# Patient Record
Sex: Female | Born: 1980 | Race: White | Hispanic: No | Smoking: Never smoker
Health system: Southern US, Community
[De-identification: ages and names within clinical notes are randomized; demographics above are authoritative.]

## PROBLEM LIST (undated history)

## (undated) DIAGNOSIS — Z302 Encounter for sterilization: Secondary | ICD-10-CM

## (undated) DIAGNOSIS — I10 Essential (primary) hypertension: Secondary | ICD-10-CM

## (undated) HISTORY — DX: Encounter for sterilization: Z30.2

---

## 2000-01-22 ENCOUNTER — Other Ambulatory Visit: Admission: RE | Admit: 2000-01-22 | Discharge: 2000-01-22 | Payer: Self-pay | Admitting: Obstetrics and Gynecology

## 2000-06-02 ENCOUNTER — Inpatient Hospital Stay (HOSPITAL_COMMUNITY): Admission: AD | Admit: 2000-06-02 | Discharge: 2000-06-02 | Payer: Self-pay | Admitting: Obstetrics

## 2000-06-06 ENCOUNTER — Encounter: Payer: Self-pay | Admitting: *Deleted

## 2000-06-06 ENCOUNTER — Ambulatory Visit (HOSPITAL_COMMUNITY): Admission: RE | Admit: 2000-06-06 | Discharge: 2000-06-06 | Payer: Self-pay | Admitting: *Deleted

## 2000-06-27 ENCOUNTER — Ambulatory Visit (HOSPITAL_COMMUNITY): Admission: RE | Admit: 2000-06-27 | Discharge: 2000-06-27 | Payer: Self-pay | Admitting: *Deleted

## 2000-06-27 ENCOUNTER — Encounter: Payer: Self-pay | Admitting: *Deleted

## 2000-07-18 ENCOUNTER — Ambulatory Visit (HOSPITAL_COMMUNITY): Admission: RE | Admit: 2000-07-18 | Discharge: 2000-07-18 | Payer: Self-pay | Admitting: *Deleted

## 2000-08-29 ENCOUNTER — Encounter (HOSPITAL_COMMUNITY): Admission: RE | Admit: 2000-08-29 | Discharge: 2000-09-01 | Payer: Self-pay | Admitting: Obstetrics

## 2000-08-31 ENCOUNTER — Inpatient Hospital Stay (HOSPITAL_COMMUNITY): Admission: AD | Admit: 2000-08-31 | Discharge: 2000-09-03 | Payer: Self-pay | Admitting: *Deleted

## 2003-04-06 ENCOUNTER — Encounter: Admission: RE | Admit: 2003-04-06 | Discharge: 2003-04-06 | Payer: Self-pay | Admitting: *Deleted

## 2003-04-20 ENCOUNTER — Encounter: Admission: RE | Admit: 2003-04-20 | Discharge: 2003-04-20 | Payer: Self-pay | Admitting: *Deleted

## 2003-05-04 ENCOUNTER — Encounter: Admission: RE | Admit: 2003-05-04 | Discharge: 2003-05-04 | Payer: Self-pay | Admitting: *Deleted

## 2003-05-25 ENCOUNTER — Encounter: Admission: RE | Admit: 2003-05-25 | Discharge: 2003-05-25 | Payer: Self-pay | Admitting: *Deleted

## 2003-06-06 ENCOUNTER — Emergency Department (HOSPITAL_COMMUNITY): Admission: AD | Admit: 2003-06-06 | Discharge: 2003-06-06 | Payer: Self-pay | Admitting: Family Medicine

## 2003-06-08 ENCOUNTER — Encounter: Admission: RE | Admit: 2003-06-08 | Discharge: 2003-06-08 | Payer: Self-pay | Admitting: *Deleted

## 2003-06-15 ENCOUNTER — Encounter: Admission: RE | Admit: 2003-06-15 | Discharge: 2003-06-15 | Payer: Self-pay | Admitting: *Deleted

## 2003-06-29 ENCOUNTER — Encounter: Admission: RE | Admit: 2003-06-29 | Discharge: 2003-06-29 | Payer: Self-pay | Admitting: *Deleted

## 2003-06-29 ENCOUNTER — Ambulatory Visit (HOSPITAL_COMMUNITY): Admission: RE | Admit: 2003-06-29 | Discharge: 2003-06-29 | Payer: Self-pay | Admitting: *Deleted

## 2003-07-07 ENCOUNTER — Inpatient Hospital Stay (HOSPITAL_COMMUNITY): Admission: AD | Admit: 2003-07-07 | Discharge: 2003-07-07 | Payer: Self-pay | Admitting: Family Medicine

## 2003-07-13 ENCOUNTER — Encounter: Admission: RE | Admit: 2003-07-13 | Discharge: 2003-07-13 | Payer: Self-pay | Admitting: *Deleted

## 2003-07-20 ENCOUNTER — Encounter: Admission: RE | Admit: 2003-07-20 | Discharge: 2003-07-20 | Payer: Self-pay | Admitting: *Deleted

## 2003-07-27 ENCOUNTER — Encounter: Admission: RE | Admit: 2003-07-27 | Discharge: 2003-07-27 | Payer: Self-pay | Admitting: *Deleted

## 2003-08-03 ENCOUNTER — Encounter: Admission: RE | Admit: 2003-08-03 | Discharge: 2003-08-03 | Payer: Self-pay | Admitting: *Deleted

## 2003-08-10 ENCOUNTER — Inpatient Hospital Stay (HOSPITAL_COMMUNITY): Admission: AD | Admit: 2003-08-10 | Discharge: 2003-08-10 | Payer: Self-pay | Admitting: Obstetrics and Gynecology

## 2003-08-10 ENCOUNTER — Encounter: Admission: RE | Admit: 2003-08-10 | Discharge: 2003-08-10 | Payer: Self-pay | Admitting: *Deleted

## 2003-08-17 ENCOUNTER — Encounter: Admission: RE | Admit: 2003-08-17 | Discharge: 2003-08-17 | Payer: Self-pay | Admitting: *Deleted

## 2003-08-19 ENCOUNTER — Ambulatory Visit (HOSPITAL_COMMUNITY): Admission: RE | Admit: 2003-08-19 | Discharge: 2003-08-19 | Payer: Self-pay | Admitting: *Deleted

## 2003-08-24 ENCOUNTER — Encounter: Admission: RE | Admit: 2003-08-24 | Discharge: 2003-08-24 | Payer: Self-pay | Admitting: *Deleted

## 2003-08-31 ENCOUNTER — Encounter: Admission: RE | Admit: 2003-08-31 | Discharge: 2003-08-31 | Payer: Self-pay | Admitting: *Deleted

## 2003-08-31 ENCOUNTER — Ambulatory Visit: Admission: RE | Admit: 2003-08-31 | Discharge: 2003-08-31 | Payer: Self-pay | Admitting: Obstetrics & Gynecology

## 2003-08-31 ENCOUNTER — Ambulatory Visit (HOSPITAL_COMMUNITY): Admission: RE | Admit: 2003-08-31 | Discharge: 2003-08-31 | Payer: Self-pay | Admitting: Obstetrics & Gynecology

## 2003-09-07 ENCOUNTER — Encounter: Admission: RE | Admit: 2003-09-07 | Discharge: 2003-09-07 | Payer: Self-pay | Admitting: *Deleted

## 2003-09-07 ENCOUNTER — Inpatient Hospital Stay (HOSPITAL_COMMUNITY): Admission: AD | Admit: 2003-09-07 | Discharge: 2003-09-11 | Payer: Self-pay | Admitting: Obstetrics & Gynecology

## 2003-10-20 ENCOUNTER — Encounter: Admission: RE | Admit: 2003-10-20 | Discharge: 2003-10-20 | Payer: Self-pay | Admitting: Family Medicine

## 2005-06-10 ENCOUNTER — Emergency Department (HOSPITAL_COMMUNITY): Admission: EM | Admit: 2005-06-10 | Discharge: 2005-06-10 | Payer: Self-pay | Admitting: Emergency Medicine

## 2005-08-28 ENCOUNTER — Emergency Department (HOSPITAL_COMMUNITY): Admission: EM | Admit: 2005-08-28 | Discharge: 2005-08-28 | Payer: Self-pay | Admitting: Family Medicine

## 2006-09-15 ENCOUNTER — Emergency Department (HOSPITAL_COMMUNITY): Admission: EM | Admit: 2006-09-15 | Discharge: 2006-09-15 | Payer: Self-pay | Admitting: Emergency Medicine

## 2007-05-15 ENCOUNTER — Emergency Department (HOSPITAL_COMMUNITY): Admission: EM | Admit: 2007-05-15 | Discharge: 2007-05-15 | Payer: Self-pay | Admitting: Family Medicine

## 2007-12-25 ENCOUNTER — Inpatient Hospital Stay (HOSPITAL_COMMUNITY): Admission: AD | Admit: 2007-12-25 | Discharge: 2007-12-25 | Payer: Self-pay | Admitting: Obstetrics and Gynecology

## 2007-12-30 ENCOUNTER — Inpatient Hospital Stay (HOSPITAL_COMMUNITY): Admission: RE | Admit: 2007-12-30 | Discharge: 2008-01-01 | Payer: Self-pay | Admitting: Obstetrics and Gynecology

## 2008-09-09 ENCOUNTER — Emergency Department (HOSPITAL_COMMUNITY): Admission: EM | Admit: 2008-09-09 | Discharge: 2008-09-09 | Payer: Self-pay | Admitting: Family Medicine

## 2008-09-13 IMAGING — CT CT ABDOMEN W/O CM
3 series · 14 of 32 positions shown, 19 images · non-contrast
Comparison: none

CLINICAL DATA: Right lower abdominal and flank pain.

[Series 2: r/o stone · axial · 0.91mm/px · z∈[-442,-202]mm · 4 of 81 slices shown, 9 images]
[im 17/81  soft-tissue]
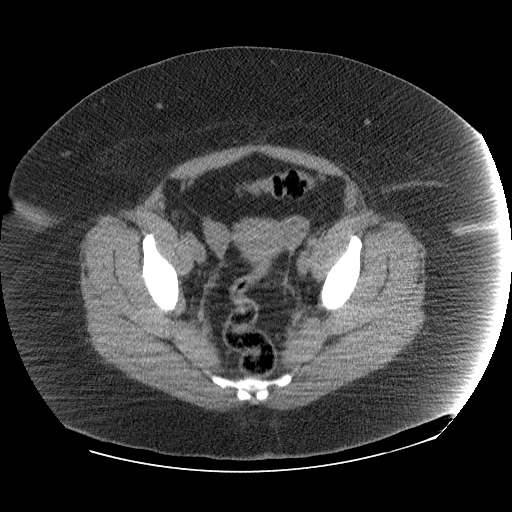
[im 17/81  lung]
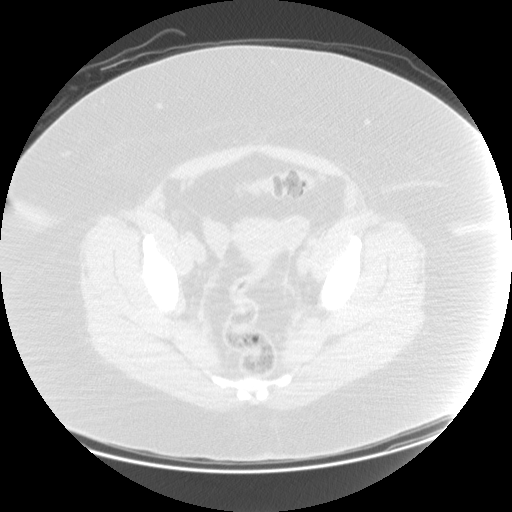
[im 17/81  bone]
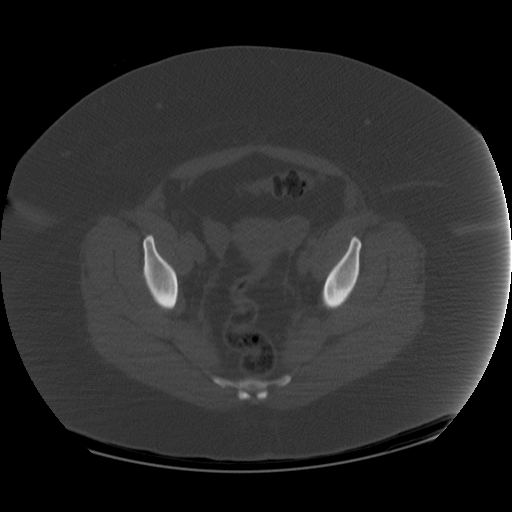
[im 33/81  soft-tissue]
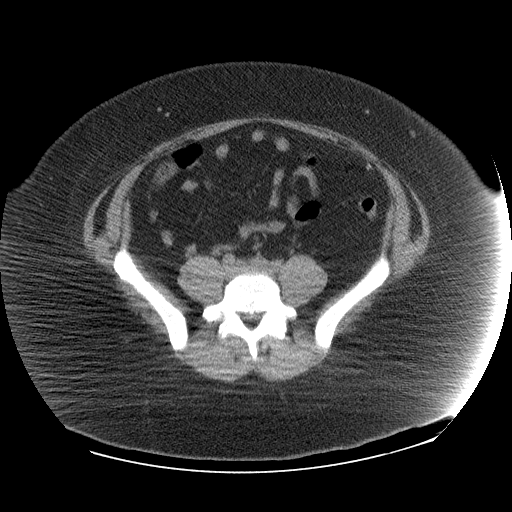
[im 33/81  lung]
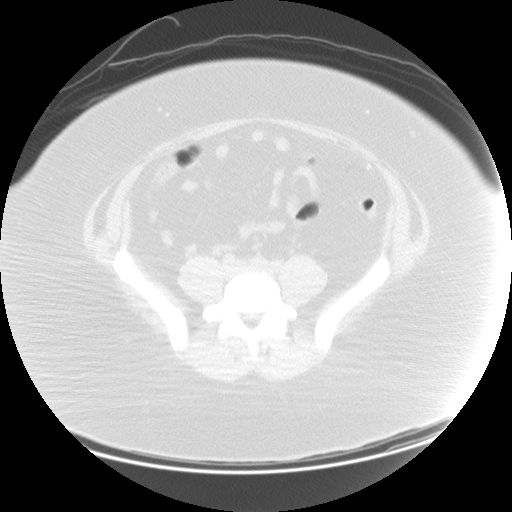
[im 49/81  soft-tissue]
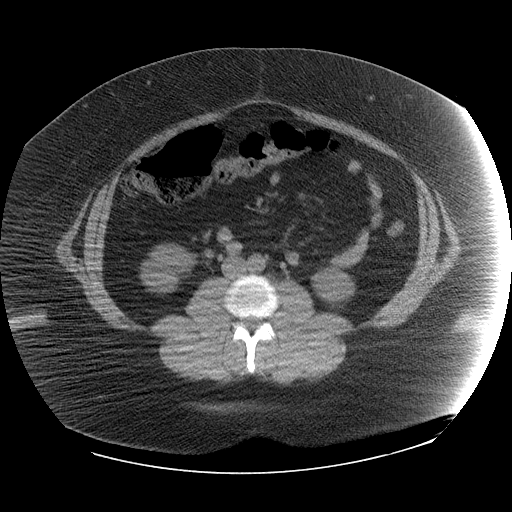
[im 49/81  lung]
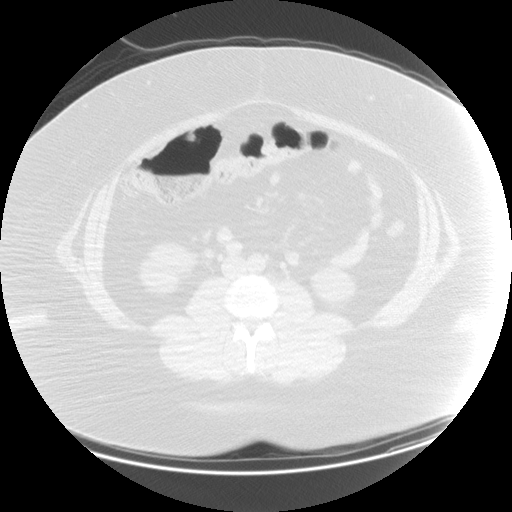
[im 65/81  soft-tissue]
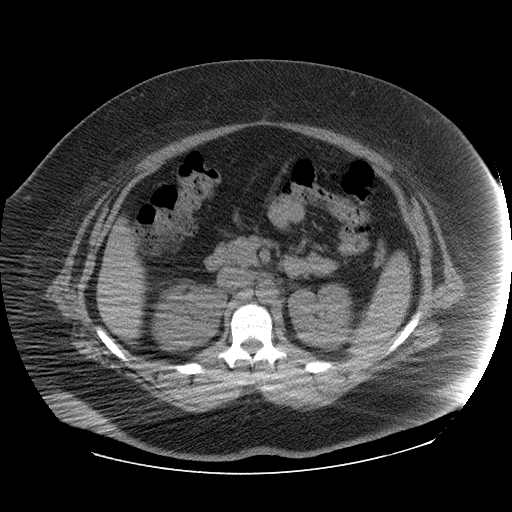
[im 65/81  lung]
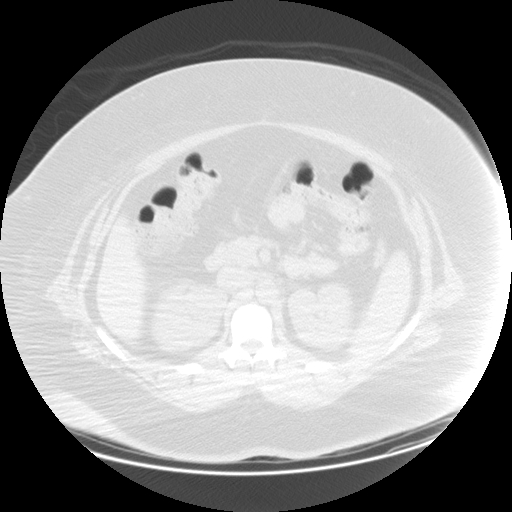

[Series 400: reformatted · coronal · 0.91mm/px · 2 of 162 slices shown (1 of 2)]
[im 15/162  soft-tissue]
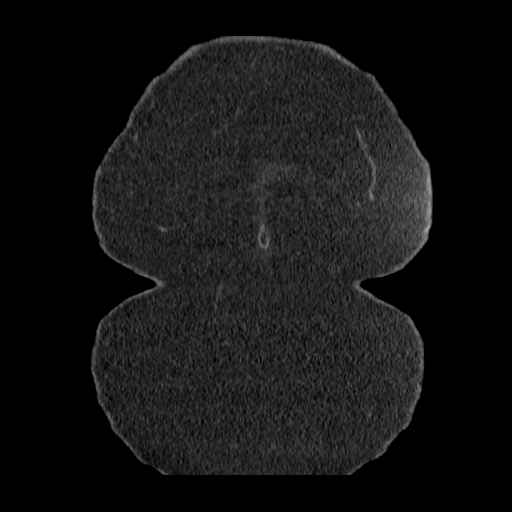
[im 30/162  soft-tissue]
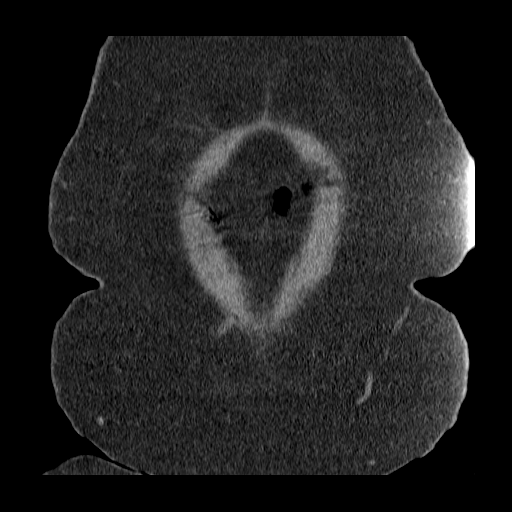

[Series 401: reformatted · sagittal · 0.91mm/px · 8 of 195 slices shown (2 of 2)]
[im 15/195  soft-tissue]
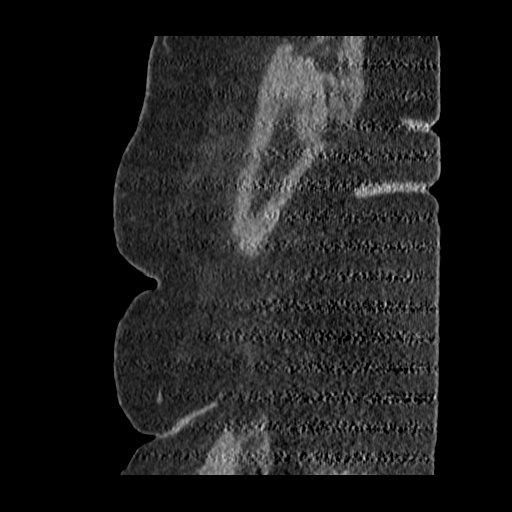
[im 45/195  soft-tissue]
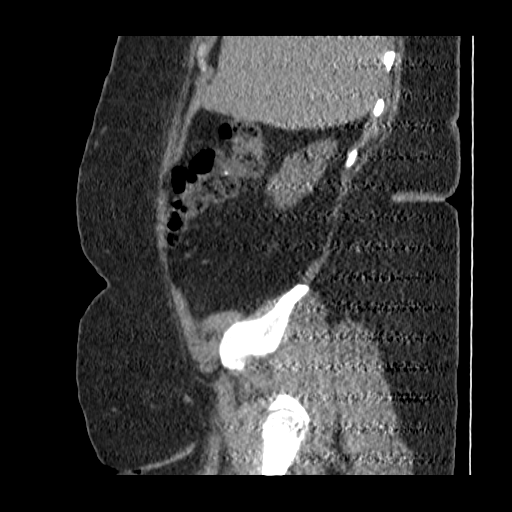
[im 60/195  soft-tissue]
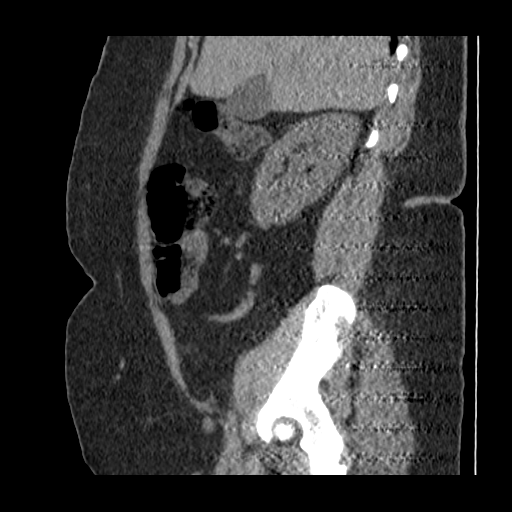
[im 90/195  soft-tissue]
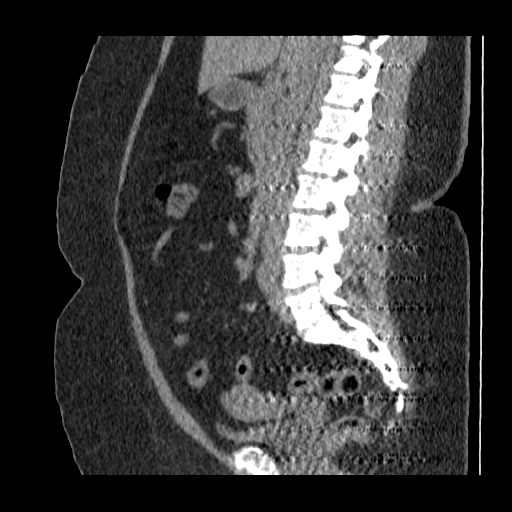
[im 105/195  soft-tissue]
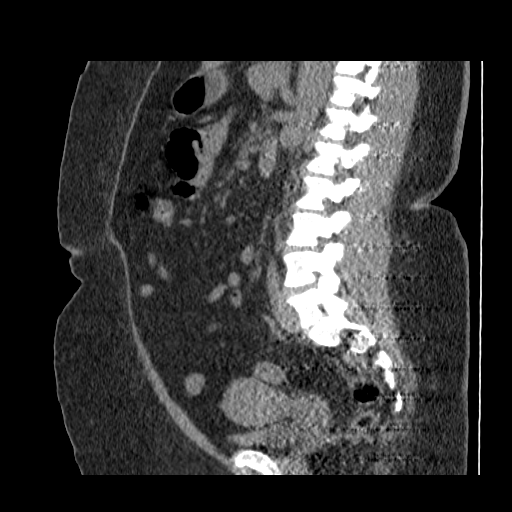
[im 135/195  soft-tissue]
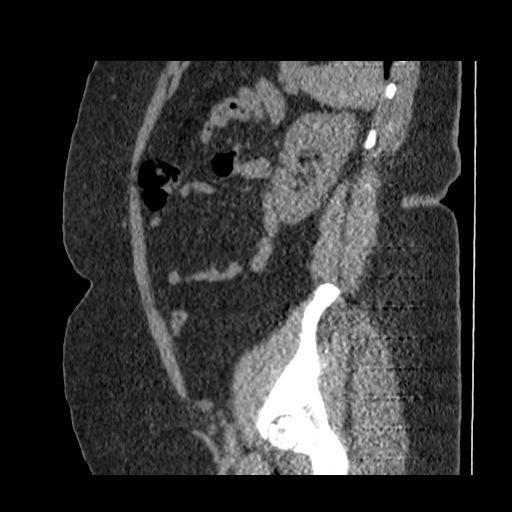
[im 150/195  soft-tissue]
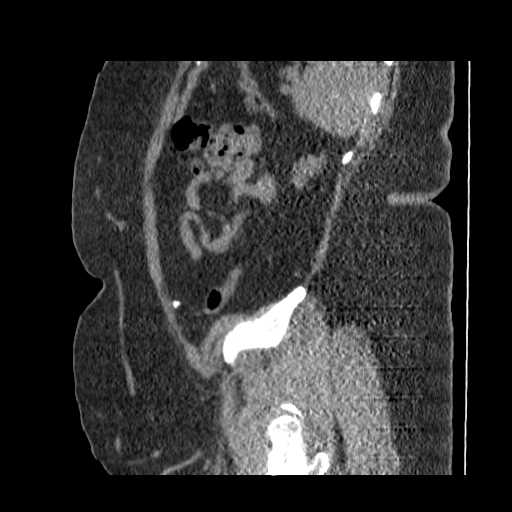
[im 180/195  soft-tissue]
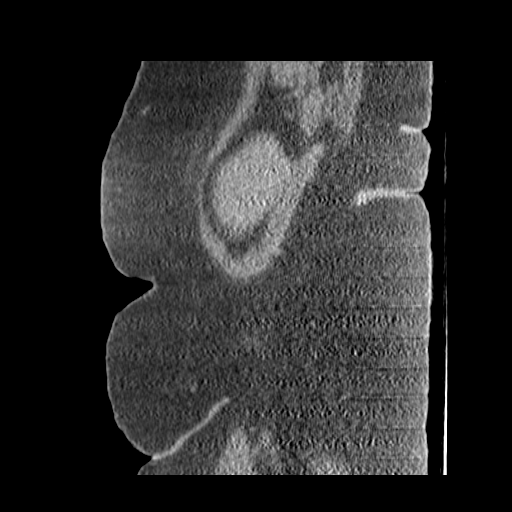

[14 of 32 positions shown; findings below may reference images not displayed]

CT abdomen without contrast:

No previous for comparison. There is moderate right hydronephrosis and
ureterectasis with some streaky inflammatory/edematous changes around the right
kidney. Negative for nephrolithiasis. Unremarkable noncontrast evaluation of
visualized portions of liver and spleen, gallbladder, left kidney, pancreas,
abdominal aorta, small bowel. No free air. No ascites.
IMPRESSION: 1. Right hydronephrosis and ureterectasis.

CT pelvis without contrast:

The right ureterectasis extends all the way down to the distal ureter where
there is a 3 mm calculus, image 72. Urinary bladder incompletely distended. No
free fluid. Uterus and adnexal regions unremarkable. Normal appendix. Colon is
decompressed, unremarkable.
IMPRESSION: 1. 3 mm distal right ureteral calculus.

## 2008-09-18 ENCOUNTER — Emergency Department (HOSPITAL_COMMUNITY): Admission: EM | Admit: 2008-09-18 | Discharge: 2008-09-18 | Payer: Self-pay | Admitting: Family Medicine

## 2010-07-08 ENCOUNTER — Encounter: Payer: Self-pay | Admitting: Obstetrics and Gynecology

## 2010-10-22 ENCOUNTER — Emergency Department (HOSPITAL_COMMUNITY)
Admission: EM | Admit: 2010-10-22 | Discharge: 2010-10-22 | Disposition: A | Discharge status: Home / Self Care | Payer: Self-pay | Attending: Emergency Medicine | Admitting: Emergency Medicine

## 2010-10-22 DIAGNOSIS — R509 Fever, unspecified: Secondary | ICD-10-CM | POA: Insufficient documentation

## 2010-10-22 DIAGNOSIS — J039 Acute tonsillitis, unspecified: Secondary | ICD-10-CM | POA: Insufficient documentation

## 2010-10-22 DIAGNOSIS — I10 Essential (primary) hypertension: Secondary | ICD-10-CM | POA: Insufficient documentation

## 2010-10-30 NOTE — Discharge Summary (Signed)
NAMEBRYELLE, Elizabeth Hendrix        ACCOUNT NO.:  [id number]   MEDICAL RECORD NO.:  [id number]          PATIENT TYPE:  INP   LOCATION:  9123                          FACILITY:  [facility]   PHYSICIAN:  Sherron Monday, MD        DATE OF BIRTH:  1980/11/21   DATE OF ADMISSION:  12/30/2007  DATE OF DISCHARGE:  01/01/2008                               DISCHARGE SUMMARY   ADMITTING DIAGNOSIS:  Intrauterine pregnancy at term, complicated by  chronic hypertension and history of pregnancy-induced hypertension.   DISCHARGE DIAGNOSIS:  Intrauterine pregnancy at term, complicated by  chronic hypertension and history of pregnancy-induced hypertension,  delivered.   HISTORY OF PRESENT ILLNESS:  A 30 year old, G4, P3-0-0-3 at 72 plus  weeks for induction of labor secondary to chronic hypertension.  Good  fetal movements, no loss of fluid, no vaginal bleeding, and occasional  contractions.  Pregnancy is complicated by chronic hypertension.  She is  on labetalol 100 mg twice a day, otherwise, uncomplicated prenatal care.   PAST MEDICAL HISTORY:  Significant for morbid obesity as well as chronic  hypertension.   PAST SURGICAL HISTORY:  Significant for surgically extracted teeth.   PAST OB/GYN HISTORY:  G1 is a term delivery due to term delivery with  pregnancy-induced hypertension and postpartum hemorrhage.  G2 is the  term delivery with pregnancy-induced hypertension and a postpartum  hemorrhage.  G4 is the present pregnancy.  No history of any abnormal  Pap smear or any sexually transmitted diseases.   MEDICATIONS:  Prenatal vitamins and labetalol.   ALLERGIES:  No known drug allergies.   SOCIAL HISTORY:  She denies alcohol, tobacco, or drug use.   FAMILY HISTORY:  Significant for diabetes in maternal grandfather, COPD  in maternal grandmother, brother with depression, mother with cervical  cancer, hypertension in mother and maternal grandfather.   PRENATAL LABS:  Hemoglobin 12.2 and platelets  172,000.  O positive.  Antibody screen negative.  Gonorrhea negative.  Chlamydia negative.  RPR  nonreactive.  Rubella immune.  Hepatitis B surface antigen negative.  HIV negative.  Baseline 24-hour urine revealed 175 mg of protein, a  adequate sample.  Her first trimester screen within limits.  Cystic  fibrosis negative.  AFB within normal limits.  A 1-hour Glucola of  138,  3-hour of 82, 146, 101, and 61.  Group B strep was negative.  First  trimester ultrasound pregnancy as dated by this with an EDC of January 04, 2008, also revealed normal nuchal thickness, normal limited early  anatomy 224, normal anatomy at 19-week ultrasound, 238 grams, a female  infant, 231, 24-1/7 week.  Ultrasound followup anatomy mostly same,  within normal limits, 731 grams.   PHYSICAL EXAM:  On admission, afebrile with vital signs stable and a  benign exam.  She is admitted for induction of labor.  She was given  Pitocin to bring the baby down and as the baby was _better applied she  was AROM'd and an IUPC was inserted as well as FSE placed.  As her labor  progressed, it was realized that the baby was a face presentation with  mentum anterior, was  discussed with maternal fetal medicine, and they  felt that the patient would be able to deliver from this position  without difficulty given her multiparous status and adequate pelvis.  She was complete-complete at +2 and pushed for 5 pushes to delivery baby  left mentum anterior with restitution to left occiput at 2216 p.m. with  Apgars of 8 at 1 minute and 9 at 5 minutes and weight of 7 pounds 14  ounces.  Placenta was expressed intact at 2219.  A second degree  perineal laceration was repaired with 3-0 Vicryl in a typical fashion.  Her postpartum course is relatively uncomplicated.  She remained  afebrile with vital signs stable.  Her hemoglobin decreased from 12.6 to  11.9.  On date of discharge, postpartum day #2, she was doing well  ambulating and tolerating a  p.o. diet without problems.  Pain was well  controlled.  She desires a tubal ligation.  She was stop by the office  and sign tubal papers and this will arranged in her postpartum course.  Her blood pressure was well controlled on medicines.  She will continue  to monitor her blood pressure and will stop in for a check in  approximately 2 weeks.  She was given a routine discharge information  and numbers to call with any questions or problems.  She plans to bottle  feed.  She is O positive, rubella immune.  Her hemoglobin decreased from  12.6 to 11.9 and she will get a tubal ligation for contraception.      Sherron Monday, MD  Electronically Signed     JB/MEDQ  D:  01/01/2008  T:  01/02/2008  Job:  [id number]

## 2010-11-02 NOTE — Discharge Summary (Signed)
Elizabeth Hendrix, Elizabeth Hendrix                    ACCOUNT NO.:  [id number]   MEDICAL RECORD NO.:  [id number]                   PATIENT TYPE:  INP   LOCATION:  9146                                 FACILITY:  [facility]   PHYSICIAN:  Nilda Simmer, M.D.                  DATE OF BIRTH:  1980/12/21   DATE OF ADMISSION:  09/07/2003  DATE OF DISCHARGE:  09/11/2003                                 DISCHARGE SUMMARY   PROCEDURES:  1. Second-degree perineal laceration repair.  2. Normal spontaneous vaginal delivery.   CONSULTATIONS:  None.   DISCHARGE DIAGNOSES:  1. Pregnancy-induced hypertension.  2. Morbid obesity.  3. Spontaneous vaginal delivery of a viable female infant.  4. GBS positive.  5. Rubella equivocal status.  6. Postpartum hemorrhage.   DISCHARGE MEDICATIONS:  1. Ibuprofen 600 mg p.o. q.6h. p.r.n. pain.  2. Prenatal vitamins one p.o. daily for the next six weeks.  3. Micronor one p.o. daily for contraception.   FOLLOWUP:  1. The patient is recommended follow-up at [facility] in six weeks for     postpartum evaluation.  2. The patient is recommended follow-up with Dr. Allena Katz, his primary care     physician in [place], in the upcoming two to four weeks for blood     pressure evaluation.   HOSPITAL COURSE:  Ms. Noland is a 30 year old, G3, P2-0-0-2,  presenting at 39-3/7 weeks with proteinuria, weight gain, elevated blood  pressure. The patient was admitted for induction of labor for pregnancy-  induced hypertension. The patient received Pitocin augmentation to  facilitate labor. The patient also received penicillin for GBS positive  status. Normal spontaneous vaginal delivery of a female infant with Apgars of  8 at 1 minute and 9 at 5 minutes. A  second-degree laceration was repaired  with 3-0 Vicryl. The patient did suffer a postpartum hemorrhage for  approximately 1000 cc of blood and received Cytotec per rectum with  resolution of hemorrhaging.  The patient was  continued on magnesium sulfate  therapy during the labor process as well as in the postpartum period for 24  hours for PIH. She had adequate diuresis and tolerated magnesium without  difficulty. The patient is bottle feeding, requested SEPs for contraception.   DISCHARGE LABORATORY:  White blood cells 12.3, hemoglobin 10.2, hematocrit  29.6, platelet count 159,000. Creatinine is 0.5.  AST 24, ALT 18, LDH 160.  Uric acid of 4.6.  RPR was nonreactive. O positive blood type. Rubella  equivocal. GBS positive.   DISCHARGE INSTRUCTIONS:  Wound care per instruction booklet. The patient  should maintain hygiene in the perineal region and continue sitz baths per  comfort state at home. Recommend a low-salt, low-fat diet.   ACTIVITY:  The patient can resume her regular activity level. Sexual  activity--nothing per vagina for six weeks.   SPECIAL INSTRUCTIONS:  The patient is instructed to return to the [facility] for development of headache, visual changes, right  upper quadrant  pain, and worsening edema. The patient expressed understanding of the above.                                               Nilda Simmer, M.D.    KS/MEDQ  D:  09/11/2003  T:  09/12/2003  Job:  [id number]

## 2011-03-14 LAB — URINALYSIS, ROUTINE W REFLEX MICROSCOPIC
Glucose, UA: NEGATIVE
Hgb urine dipstick: NEGATIVE
Specific Gravity, Urine: >1.03 — ABNORMAL HIGH

## 2011-03-14 LAB — CBC
MCHC: 34.3
Platelets: 173
RBC: 4.31
RDW: 14.1

## 2011-03-14 LAB — COMPREHENSIVE METABOLIC PANEL
ALT: 17
AST: 25
Albumin: 3 — ABNORMAL LOW
Calcium: 10
GFR calc Af Amer: >60
Sodium: 139
Total Protein: 5.8 — ABNORMAL LOW

## 2011-03-15 LAB — CBC
HCT: 34.6 — ABNORMAL LOW
HCT: 36.3
Hemoglobin: 11.9 — ABNORMAL LOW
MCV: 89.4
RBC: 3.84 — ABNORMAL LOW
RBC: 4.05
RDW: 14.3
WBC: 9.1

## 2011-03-15 LAB — RPR: RPR Ser Ql: NONREACTIVE

## 2011-06-19 ENCOUNTER — Emergency Department (HOSPITAL_COMMUNITY)
Admission: EM | Admit: 2011-06-19 | Discharge: 2011-06-19 | Disposition: A | Discharge status: Home / Self Care | Payer: Self-pay | Attending: Emergency Medicine | Admitting: Emergency Medicine

## 2011-06-19 ENCOUNTER — Encounter: Payer: Self-pay | Admitting: *Deleted

## 2011-06-19 DIAGNOSIS — R220 Localized swelling, mass and lump, head: Secondary | ICD-10-CM | POA: Insufficient documentation

## 2011-06-19 DIAGNOSIS — IMO0001 Reserved for inherently not codable concepts without codable children: Secondary | ICD-10-CM | POA: Insufficient documentation

## 2011-06-19 DIAGNOSIS — R0982 Postnasal drip: Secondary | ICD-10-CM | POA: Insufficient documentation

## 2011-06-19 DIAGNOSIS — J3489 Other specified disorders of nose and nasal sinuses: Secondary | ICD-10-CM | POA: Insufficient documentation

## 2011-06-19 DIAGNOSIS — R51 Headache: Secondary | ICD-10-CM | POA: Insufficient documentation

## 2011-06-19 DIAGNOSIS — Z79899 Other long term (current) drug therapy: Secondary | ICD-10-CM | POA: Insufficient documentation

## 2011-06-19 DIAGNOSIS — J329 Chronic sinusitis, unspecified: Secondary | ICD-10-CM | POA: Insufficient documentation

## 2011-06-19 DIAGNOSIS — R5381 Other malaise: Secondary | ICD-10-CM | POA: Insufficient documentation

## 2011-06-19 HISTORY — DX: Essential (primary) hypertension: I10

## 2011-06-19 MED ORDER — AMOXICILLIN-POT CLAVULANATE 875-125 MG PO TABS: 1.0000 | ORAL_TABLET | Freq: Two times a day (BID) | ORAL | Status: AC

## 2011-06-19 MED ORDER — IBUPROFEN 600 MG PO TABS: 600.0000 mg | ORAL_TABLET | Freq: Four times a day (QID) | ORAL | Status: AC | PRN

## 2011-06-19 NOTE — ED Notes (Signed)
Alert, talking,  Headache, and sinus pressure.

## 2011-06-19 NOTE — ED Notes (Signed)
Head congestion,body ache, headache

## 2011-06-21 NOTE — ED Provider Notes (Signed)
Medical screening examination/treatment/procedure(s) were performed by non-physician practitioner and as supervising physician I was immediately available for consultation/collaboration.  Nicoletta Dress. Colon Branch, MD 06/21/11 [phone]

## 2011-06-21 NOTE — ED Provider Notes (Signed)
History     CSN: [id number]  Arrival date & time 06/19/11  1624   First MD Initiated Contact with Patient 06/19/11 1811      Chief Complaint  Patient presents with  . Nasal Congestion    (Consider location/radiation/quality/duration/timing/severity/associated sxs/prior treatment) HPI Comments: Patient presents with a 4 days history of increasing pain and pressure in her sinus's.  She has increased nasal congestion,  Reporting drainage has been thick and blood tinged for the past day.  She has generalized headache and body aches as well.  Has had subjective fever.  She has tried alka seltzer cold and cough formula without relief.  She states her symptoms are worse at night and sinus pressure is worse with palpation over her cheeks and when she bends forward.  She denies dizziness, weakness,  But has been fatigued.  Has had post nasal drip also blood tinged.  No epistaxis.  The history is provided by the patient.    Past Medical History  Diagnosis Date  . Hypertension     History reviewed. No pertinent past surgical history.  History reviewed. No pertinent family history.  History  Substance Use Topics  . Smoking status: Never Smoker   . Smokeless tobacco: Not on file  . Alcohol Use: No    OB History    Grav Para Term Preterm Abortions TAB SAB Ect Mult Living                  Review of Systems  Constitutional: Negative for fever.  HENT: Positive for congestion, postnasal drip and sinus pressure. Negative for ear pain, nosebleeds, sore throat and neck pain.   Eyes: Negative.   Respiratory: Negative for chest tightness and shortness of breath.   Cardiovascular: Negative for chest pain.  Gastrointestinal: Negative for nausea and abdominal pain.  Genitourinary: Negative.   Musculoskeletal: Negative for joint swelling and arthralgias.  Skin: Negative.  Negative for rash and wound.  Neurological: Positive for headaches. Negative for dizziness, weakness, light-headedness and  numbness.  Hematological: Negative.   Psychiatric/Behavioral: Negative.     Allergies  Review of patient's allergies indicates no known allergies.  Home Medications   Current Outpatient Rx  Name Route Sig Dispense Refill  . PHENYLEPH-CPM-DM-APAP 10-16-08-325 MG PO CAPS Oral Take 2 capsules by mouth as needed. For cold and cough     . AMOXICILLIN-POT CLAVULANATE 875-125 MG PO TABS Oral Take 1 tablet by mouth every 12 (twelve) hours. 20 tablet 0  . IBUPROFEN 600 MG PO TABS Oral Take 1 tablet (600 mg total) by mouth every 6 (six) hours as needed for pain. 30 tablet 0    BP 144/84  Pulse 88  Temp(Src) 98.5 F (36.9 C) (Oral)  Resp 20  SpO2 100%  LMP 06/09/2011  Physical Exam  Nursing note and vitals reviewed. Constitutional: She is oriented to person, place, and time. She appears well-developed and well-nourished.  HENT:  Head: Normocephalic and atraumatic.  Right Ear: External ear normal.  Left Ear: External ear normal.  Nose: Mucosal edema and rhinorrhea present. Right sinus exhibits maxillary sinus tenderness. Left sinus exhibits maxillary sinus tenderness.  Mouth/Throat: Uvula is midline, oropharynx is clear and moist and mucous membranes are normal. No oropharyngeal exudate, posterior oropharyngeal edema or posterior oropharyngeal erythema.  Eyes: Conjunctivae and EOM are normal. Pupils are equal, round, and reactive to light.  Neck: Normal range of motion.  Cardiovascular: Normal rate, regular rhythm, normal heart sounds and intact distal pulses.   Pulmonary/Chest: Effort normal  and breath sounds normal. She has no wheezes.  Abdominal: Soft. Bowel sounds are normal. There is no tenderness.  Musculoskeletal: Normal range of motion.  Neurological: She is alert and oriented to person, place, and time. No cranial nerve deficit.  Skin: Skin is warm and dry.  Psychiatric: She has a normal mood and affect.    ED Course  Procedures (including critical care time)  Labs Reviewed  - No data to display No results found.   1. Sinusitis       MDM  Augmentin,  Ibuprofen.        Candis Musa, PA 06/21/11 1453

## 2015-06-29 ENCOUNTER — Ambulatory Visit: Payer: Self-pay | Admitting: Physician Assistant

## 2015-06-29 ENCOUNTER — Encounter: Payer: Self-pay | Admitting: Physician Assistant

## 2015-06-29 VITALS — BP 150/86 | HR 89 | Temp 97.9°F | Ht 64.25 in | Wt 346.0 lb

## 2015-06-29 DIAGNOSIS — I10 Essential (primary) hypertension: Secondary | ICD-10-CM | POA: Insufficient documentation

## 2015-06-29 DIAGNOSIS — K0889 Other specified disorders of teeth and supporting structures: Secondary | ICD-10-CM

## 2015-06-29 MED ORDER — AMOXICILLIN 500 MG PO CAPS: 500.0000 mg | ORAL_CAPSULE | Freq: Three times a day (TID) | ORAL | Status: DC

## 2015-06-29 NOTE — Progress Notes (Signed)
   BP 150/86 mmHg  Pulse 89  Temp(Src) 97.9 F (36.6 C)  Ht 5' 4.25" (1.632 m)  Wt 346 lb (156.945 kg)  BMI 58.93 kg/m2  SpO2 98%   Subjective:    Patient ID: Elizabeth Hendrix, female    DOB: 03/07/1981, 35 y.o.   MRN: [id number]  HPI: Elizabeth Hendrix is a 35 y.o. female presenting on 06/29/2015 for Hypertension   HPI   Pt doing okay but c/o toothache x 1 week.    Relevant past medical, surgical, family and social history reviewed and updated as indicated. Interim medical history since our last visit reviewed. Allergies and medications reviewed and updated.  Current outpatient prescriptions:  .  amLODipine (NORVASC) 10 MG tablet, Take 10 mg by mouth daily., Disp: , Rfl:    Review of Systems  Constitutional: Negative for fever, chills, diaphoresis, appetite change, fatigue and unexpected weight change.  HENT: Positive for dental problem and sneezing. Negative for congestion, drooling, ear pain, facial swelling, hearing loss, mouth sores, sore throat, trouble swallowing and voice change.   Eyes: Negative for pain, discharge, redness, itching and visual disturbance.  Respiratory: Negative for cough, choking, shortness of breath and wheezing.   Cardiovascular: Negative for chest pain, palpitations and leg swelling.  Gastrointestinal: Negative for vomiting, abdominal pain, diarrhea, constipation and blood in stool.  Endocrine: Negative for cold intolerance, heat intolerance and polydipsia.  Genitourinary: Negative for dysuria, hematuria and decreased urine volume.  Musculoskeletal: Positive for arthralgias. Negative for back pain and gait problem.  Skin: Negative for rash.  Allergic/Immunologic: Negative for environmental allergies.  Neurological: Negative for seizures, syncope, light-headedness and headaches.  Hematological: Negative for adenopathy.  Psychiatric/Behavioral: Negative for suicidal ideas, dysphoric mood and agitation. The patient is not nervous/anxious.      Per HPI unless specifically indicated above     Objective:    BP 150/86 mmHg  Pulse 89  Temp(Src) 97.9 F (36.6 C)  Ht 5' 4.25" (1.632 m)  Wt 346 lb (156.945 kg)  BMI 58.93 kg/m2  SpO2 98%  Wt Readings from Last 3 Encounters:  06/29/15 346 lb (156.945 kg)    Physical Exam  Constitutional: She is oriented to person, place, and time. She appears well-developed and well-nourished.  HENT:  Head: Normocephalic and atraumatic.  Mouth/Throat: Uvula is midline.  No facial swelling. No dental abscess seen  Neck: Neck supple.  Cardiovascular: Normal rate and regular rhythm.   Pulmonary/Chest: Effort normal and breath sounds normal.  Abdominal: Soft. Bowel sounds are normal. She exhibits no mass. There is no tenderness. There is no rigidity and no guarding.  obese  Musculoskeletal: She exhibits no edema.  Lymphadenopathy:    She has no cervical adenopathy.  Neurological: She is alert and oriented to person, place, and time.  Skin: Skin is warm and dry.  Psychiatric: She has a normal mood and affect. Her behavior is normal.  Vitals reviewed.       Assessment & Plan:   Encounter Diagnoses  Name Primary?  . Essential hypertension, benign Yes  . Morbid obesity, unspecified obesity type (HCC)   . Dentalgia     -awaiting labs (not in EPIC- are being faxed but pt neeed to get to work) -rx amoxil for toothache and put on dental list -F/u 1 mo to recheck bp and review labs

## 2015-07-26 ENCOUNTER — Other Ambulatory Visit: Payer: Self-pay | Admitting: Physician Assistant

## 2015-08-03 ENCOUNTER — Ambulatory Visit: Payer: Self-pay | Admitting: Physician Assistant

## 2015-08-14 ENCOUNTER — Ambulatory Visit: Payer: Self-pay | Admitting: Physician Assistant

## 2015-08-31 ENCOUNTER — Ambulatory Visit: Payer: Self-pay | Admitting: Physician Assistant

## 2015-10-23 ENCOUNTER — Other Ambulatory Visit: Payer: Self-pay | Admitting: Physician Assistant

## 2015-11-07 ENCOUNTER — Ambulatory Visit: Payer: Self-pay | Admitting: Physician Assistant

## 2015-11-14 ENCOUNTER — Encounter: Payer: Self-pay | Admitting: Physician Assistant

## 2015-11-14 ENCOUNTER — Ambulatory Visit: Payer: Self-pay | Admitting: Physician Assistant

## 2015-11-14 VITALS — BP 130/80 | HR 75 | Temp 97.7°F | Ht 64.25 in | Wt 346.0 lb

## 2015-11-14 DIAGNOSIS — I10 Essential (primary) hypertension: Secondary | ICD-10-CM

## 2015-11-14 MED ORDER — AMLODIPINE BESYLATE 10 MG PO TABS: ORAL_TABLET | ORAL | Status: DC

## 2015-11-14 NOTE — Progress Notes (Signed)
   BP 130/80 mmHg  Pulse 75  Temp(Src) 97.7 F (36.5 C)  Ht 5' 4.25" (1.632 m)  Wt 346 lb (156.945 kg)  BMI 58.93 kg/m2  SpO2 99%   Subjective:    Patient ID: Elizabeth Hendrix, female    DOB: 01/24/1981, 35 y.o.   MRN: [id number]  HPI: Elizabeth Hendrix is a 35 y.o. female presenting on 11/14/2015 for Hypertension   HPI Pt doing well.  No complaints  Relevant past medical, surgical, family and social history reviewed and updated as indicated. Interim medical history since our last visit reviewed. Allergies and medications reviewed and updated.   Current outpatient prescriptions:  .  amLODipine (NORVASC) 10 MG tablet, TAKE ONE TABLET BY MOUTH ONCE DAILY FOR BLOOD PRESSURE, Disp: 30 tablet, Rfl: 4   Review of Systems  Constitutional: Negative for fever, chills, diaphoresis, appetite change, fatigue and unexpected weight change.  HENT: Negative for congestion, dental problem, drooling, ear pain, facial swelling, hearing loss, mouth sores, sneezing, sore throat, trouble swallowing and voice change.   Eyes: Negative for pain, discharge, redness, itching and visual disturbance.  Respiratory: Negative for cough, choking, shortness of breath and wheezing.   Cardiovascular: Negative for chest pain, palpitations and leg swelling.  Gastrointestinal: Negative for vomiting, abdominal pain, diarrhea, constipation and blood in stool.  Endocrine: Negative for cold intolerance, heat intolerance and polydipsia.  Genitourinary: Negative for dysuria, hematuria and decreased urine volume.  Musculoskeletal: Negative for back pain, arthralgias and gait problem.  Skin: Negative for rash.  Allergic/Immunologic: Negative for environmental allergies.  Neurological: Negative for seizures, syncope, light-headedness and headaches.  Hematological: Negative for adenopathy.  Psychiatric/Behavioral: Negative for suicidal ideas, dysphoric mood and agitation. The patient is not nervous/anxious.      Per HPI unless specifically indicated above     Objective:    BP 130/80 mmHg  Pulse 75  Temp(Src) 97.7 F (36.5 C)  Ht 5' 4.25" (1.632 m)  Wt 346 lb (156.945 kg)  BMI 58.93 kg/m2  SpO2 99%  Wt Readings from Last 3 Encounters:  11/14/15 346 lb (156.945 kg)  06/29/15 346 lb (156.945 kg)    Physical Exam  Constitutional: She is oriented to person, place, and time. She appears well-developed and well-nourished.  HENT:  Head: Normocephalic and atraumatic.  Neck: Neck supple.  Cardiovascular: Normal rate and regular rhythm.   Pulmonary/Chest: Effort normal and breath sounds normal.  Abdominal: Soft. Bowel sounds are normal. She exhibits no mass. There is no hepatosplenomegaly. There is no tenderness.  obese  Musculoskeletal: She exhibits no edema.  Lymphadenopathy:    She has no cervical adenopathy.  Neurological: She is alert and oriented to person, place, and time.  Skin: Skin is warm and dry.  Psychiatric: She has a normal mood and affect. Her behavior is normal.  Vitals reviewed.       Assessment & Plan:   Encounter Diagnoses  Name Primary?  . Essential hypertension, benign Yes  . Morbid obesity, unspecified obesity type (HCC)     -reviewed Labs dated 06/26/15 with pt.  all good- cbc, cmp , liipids -continue amlodipine -f/u 4 months.  RTO sooner prn

## 2015-11-29 ENCOUNTER — Encounter: Payer: Self-pay | Admitting: Physician Assistant

## 2016-03-14 ENCOUNTER — Encounter: Payer: Self-pay | Admitting: Physician Assistant

## 2016-03-14 ENCOUNTER — Ambulatory Visit: Payer: Self-pay | Admitting: Physician Assistant

## 2016-03-14 VITALS — BP 134/96 | HR 69 | Temp 97.9°F | Ht 64.25 in | Wt 341.5 lb

## 2016-03-14 DIAGNOSIS — I10 Essential (primary) hypertension: Secondary | ICD-10-CM

## 2016-03-14 LAB — BASIC METABOLIC PANEL
BUN: 10 mg/dL (ref 7–25)
CHLORIDE: 104 mmol/L (ref 98–110)
CO2: 26 mmol/L (ref 20–31)
Calcium: 9.1 mg/dL (ref 8.6–10.2)
Creat: 0.56 mg/dL (ref 0.50–1.10)
Glucose, Bld: 96 mg/dL (ref 65–99)
Potassium: 4.4 mmol/L (ref 3.5–5.3)
SODIUM: 139 mmol/L (ref 135–146)

## 2016-03-14 NOTE — Progress Notes (Signed)
   BP (!) 134/96 (BP Location: Left Arm, Patient Position: Sitting, Cuff Size: Large)   Pulse 69   Temp 97.9 F (36.6 C)   Ht 5' 4.25" (1.632 m)   Wt (!) 341 lb 8 oz (154.9 kg)   SpO2 98%   BMI 58.16 kg/m    Subjective:    Patient ID: Elizabeth Hendrix, female    DOB: 12/20/1980, 35 y.o.   MRN: [id number]  HPI: Elizabeth Hendrix is a 35 y.o. female presenting on 03/14/2016 for Hypertension   HPI  Pt doing well. No complaints  Relevant past medical, surgical, family and social history reviewed and updated as indicated. Interim medical history since our last visit reviewed. Allergies and medications reviewed and updated.   Current Outpatient Prescriptions:  .  amLODipine (NORVASC) 10 MG tablet, TAKE ONE TABLET BY MOUTH ONCE DAILY FOR BLOOD PRESSURE, Disp: 30 tablet, Rfl: 4  Review of Systems  Constitutional: Negative for appetite change, chills, diaphoresis, fatigue, fever and unexpected weight change.  HENT: Negative for congestion, dental problem, drooling, ear pain, facial swelling, hearing loss, mouth sores, sneezing, sore throat, trouble swallowing and voice change.   Eyes: Negative for pain, discharge, redness, itching and visual disturbance.  Respiratory: Negative for cough, choking, shortness of breath and wheezing.   Cardiovascular: Negative for chest pain, palpitations and leg swelling.  Gastrointestinal: Negative for abdominal pain, blood in stool, constipation, diarrhea and vomiting.  Endocrine: Negative for cold intolerance, heat intolerance and polydipsia.  Genitourinary: Negative for decreased urine volume, dysuria and hematuria.  Musculoskeletal: Negative for arthralgias, back pain and gait problem.  Skin: Negative for rash.  Allergic/Immunologic: Negative for environmental allergies.  Neurological: Negative for seizures, syncope, light-headedness and headaches.  Hematological: Negative for adenopathy.  Psychiatric/Behavioral: Negative for agitation,  dysphoric mood and suicidal ideas. The patient is not nervous/anxious.     Per HPI unless specifically indicated above     Objective:    BP (!) 134/96 (BP Location: Left Arm, Patient Position: Sitting, Cuff Size: Large)   Pulse 69   Temp 97.9 F (36.6 C)   Ht 5' 4.25" (1.632 m)   Wt (!) 341 lb 8 oz (154.9 kg)   SpO2 98%   BMI 58.16 kg/m   Wt Readings from Last 3 Encounters:  03/14/16 (!) 341 lb 8 oz (154.9 kg)  11/14/15 (!) 346 lb (156.9 kg)  06/29/15 (!) 346 lb (156.9 kg)    Physical Exam  Constitutional: She is oriented to person, place, and time. She appears well-developed and well-nourished.  HENT:  Head: Normocephalic and atraumatic.  Neck: Neck supple.  Cardiovascular: Normal rate and regular rhythm.   Pulmonary/Chest: Effort normal and breath sounds normal.  Abdominal: Soft. Bowel sounds are normal. She exhibits no mass. There is no hepatosplenomegaly. There is no tenderness.  Musculoskeletal: She exhibits no edema.  Lymphadenopathy:    She has no cervical adenopathy.  Neurological: She is alert and oriented to person, place, and time.  Skin: Skin is warm and dry.  Psychiatric: She has a normal mood and affect. Her behavior is normal.  Vitals reviewed.       Assessment & Plan:   Encounter Diagnoses  Name Primary?  . Essential hypertension, benign Yes  . Morbid obesity, unspecified obesity type (HCC)     -Counseled on low-sodium diet and exercise to help with htn -continue current medication -check bmp today. Will call with results -f/u 4 months.  RTO sooner prn

## 2016-05-14 ENCOUNTER — Other Ambulatory Visit: Payer: Self-pay | Admitting: Physician Assistant

## 2016-07-15 ENCOUNTER — Encounter: Payer: Self-pay | Admitting: Physician Assistant

## 2016-07-15 ENCOUNTER — Ambulatory Visit: Payer: Self-pay | Admitting: Physician Assistant

## 2016-07-15 VITALS — BP 134/100 | HR 81 | Temp 98.1°F | Ht 64.25 in | Wt 341.8 lb

## 2016-07-15 DIAGNOSIS — I10 Essential (primary) hypertension: Secondary | ICD-10-CM

## 2016-07-15 DIAGNOSIS — J069 Acute upper respiratory infection, unspecified: Secondary | ICD-10-CM

## 2016-07-15 NOTE — Progress Notes (Signed)
BP (!) 134/100 (BP Location: Left Arm, Patient Position: Sitting, Cuff Size: Large)   Pulse 81   Temp 98.1 F (36.7 C)   Ht 5' 4.25" (1.632 m)   Wt (!) 341 lb 12 oz (155 kg)   SpO2 99%   BMI 58.21 kg/m    Subjective:    Patient ID: Elizabeth Hendrix, female    DOB: 09/27/1980, 36 y.o.   MRN: [id number]  HPI: Elizabeth Hendrix is a 36 y.o. female presenting on 07/15/2016 for Hypertension   HPI   Pt here for follow up on HTN. She complains of URI symptoms recently.    C/o runny nose, HA, sneezing since last Thurs. pt states taking dayquil cold and flu. pt doesn't think dayquil has worked. pt states feeling nauseaous in the morning when she wake up since Saturday.  No fevers.  One of her children is sick.  No sob or wheezing.   Relevant past medical, surgical, family and social history reviewed and updated as indicated. Interim medical history since our last visit reviewed. Allergies and medications reviewed and updated.   Current Outpatient Prescriptions:  .  Pseudoephedrine-APAP-DM (DAYQUIL PO), Take by mouth., Disp: , Rfl:  .  amLODipine (NORVASC) 10 MG tablet, TAKE ONE TABLET BY MOUTH ONCE DAILY FOR BLOOD PRESSURE, Disp: 30 tablet, Rfl: 2   Review of Systems  Constitutional: Negative for appetite change, chills, diaphoresis, fatigue, fever and unexpected weight change.  HENT: Positive for congestion and sneezing. Negative for dental problem, drooling, ear pain, facial swelling, hearing loss, mouth sores, sore throat, trouble swallowing and voice change.   Eyes: Negative for pain, discharge, redness, itching and visual disturbance.  Respiratory: Negative for cough, choking, shortness of breath and wheezing.   Cardiovascular: Negative for chest pain, palpitations and leg swelling.  Gastrointestinal: Negative for abdominal pain, blood in stool, constipation, diarrhea and vomiting.  Endocrine: Negative for cold intolerance, heat intolerance and polydipsia.   Genitourinary: Negative for decreased urine volume, dysuria and hematuria.  Musculoskeletal: Positive for arthralgias. Negative for back pain and gait problem.  Skin: Negative for rash.  Allergic/Immunologic: Negative for environmental allergies.  Neurological: Positive for headaches. Negative for seizures, syncope and light-headedness.  Hematological: Negative for adenopathy.  Psychiatric/Behavioral: Negative for agitation, dysphoric mood and suicidal ideas. The patient is not nervous/anxious.     Per HPI unless specifically indicated above     Objective:    BP (!) 134/100 (BP Location: Left Arm, Patient Position: Sitting, Cuff Size: Large)   Pulse 81   Temp 98.1 F (36.7 C)   Ht 5' 4.25" (1.632 m)   Wt (!) 341 lb 12 oz (155 kg)   SpO2 99%   BMI 58.21 kg/m   Wt Readings from Last 3 Encounters:  07/15/16 (!) 341 lb 12 oz (155 kg)  03/14/16 (!) 341 lb 8 oz (154.9 kg)  11/14/15 (!) 346 lb (156.9 kg)    Physical Exam  Constitutional: She is oriented to person, place, and time. She appears well-developed and well-nourished.  HENT:  Head: Normocephalic and atraumatic.  Right Ear: Hearing, tympanic membrane, external ear and ear canal normal.  Left Ear: Hearing, tympanic membrane, external ear and ear canal normal.  Nose: Mucosal edema and rhinorrhea present.  Mouth/Throat: Uvula is midline and oropharynx is clear and moist. No oropharyngeal exudate.  Neck: Neck supple.  Cardiovascular: Normal rate and regular rhythm.   Pulmonary/Chest: Effort normal and breath sounds normal. She has no wheezes.  Abdominal: Soft. Bowel sounds are  normal. She exhibits no mass. There is no hepatosplenomegaly. There is no tenderness.  Musculoskeletal: She exhibits no edema.  Lymphadenopathy:    She has no cervical adenopathy.  Neurological: She is alert and oriented to person, place, and time.  Skin: Skin is warm and dry.  Psychiatric: She has a normal mood and affect. Her behavior is normal.   Vitals reviewed.   Results for orders placed or performed in visit on 03/14/16  Basic Metabolic Panel (BMET)  Result Value Ref Range   Sodium 139 135 - 146 mmol/L   Potassium 4.4 3.5 - 5.3 mmol/L   Chloride 104 98 - 110 mmol/L   CO2 26 20 - 31 mmol/L   Glucose, Bld 96 65 - 99 mg/dL   BUN 10 7 - 25 mg/dL   Creat 4.090.56 8.110.50 - 9.141.10 mg/dL   Calcium 9.1 8.6 - 78.210.2 mg/dL      Assessment & Plan:   Encounter Diagnoses  Name Primary?  . Essential hypertension, benign Yes  . Acute upper respiratory infection   . Morbid obesity, unspecified obesity type (HCC)     -Counseled pt on use of OTCs prn. Counseled to use caution with decongestants due to her HTN.  Rest. Fluids -F/u 1 month to recheck bp and do PAP.  RTO sooner prn

## 2016-07-15 NOTE — Patient Instructions (Signed)
Upper Respiratory Infection, Adult Most upper respiratory infections (URIs) are a viral infection of the air passages leading to the lungs. A URI affects the nose, throat, and upper air passages. The most common type of URI is nasopharyngitis and is typically referred to as "the common cold." URIs run their course and usually go away on their own. Most of the time, a URI does not require medical attention, but sometimes a bacterial infection in the upper airways can follow a viral infection. This is called a secondary infection. Sinus and middle ear infections are common types of secondary upper respiratory infections. Bacterial pneumonia can also complicate a URI. A URI can worsen asthma and chronic obstructive pulmonary disease (COPD). Sometimes, these complications can require emergency medical care and may be life threatening. What are the causes? Almost all URIs are caused by viruses. A virus is a type of germ and can spread from one person to another. What increases the risk? You may be at risk for a URI if:  You smoke.  You have chronic heart or lung disease.  You have a weakened defense (immune) system.  You are very young or very old.  You have nasal allergies or asthma.  You work in crowded or poorly ventilated areas.  You work in health care facilities or schools.  What are the signs or symptoms? Symptoms typically develop 2-3 days after you come in contact with a cold virus. Most viral URIs last 7-10 days. However, viral URIs from the influenza virus (flu virus) can last 14-18 days and are typically more severe. Symptoms may include:  Runny or stuffy (congested) nose.  Sneezing.  Cough.  Sore throat.  Headache.  Fatigue.  Fever.  Loss of appetite.  Pain in your forehead, behind your eyes, and over your cheekbones (sinus pain).  Muscle aches.  How is this diagnosed? Your health care provider may diagnose a URI by:  Physical exam.  Tests to check that your  symptoms are not due to another condition such as: ? Strep throat. ? Sinusitis. ? Pneumonia. ? Asthma.  How is this treated? A URI goes away on its own with time. It cannot be cured with medicines, but medicines may be prescribed or recommended to relieve symptoms. Medicines may help:  Reduce your fever.  Reduce your cough.  Relieve nasal congestion.  Follow these instructions at home:  Take medicines only as directed by your health care provider.  Gargle warm saltwater or take cough drops to comfort your throat as directed by your health care provider.  Use a warm mist humidifier or inhale steam from a shower to increase air moisture. This may make it easier to breathe.  Drink enough fluid to keep your urine clear or pale yellow.  Eat soups and other clear broths and maintain good nutrition.  Rest as needed.  Return to work when your temperature has returned to normal or as your health care provider advises. You may need to stay home longer to avoid infecting others. You can also use a face mask and careful hand washing to prevent spread of the virus.  Increase the usage of your inhaler if you have asthma.  Do not use any tobacco products, including cigarettes, chewing tobacco, or electronic cigarettes. If you need help quitting, ask your health care provider. How is this prevented? The best way to protect yourself from getting a cold is to practice good hygiene.  Avoid oral or hand contact with people with cold symptoms.  Wash your   hands often if contact occurs.  There is no clear evidence that vitamin C, vitamin E, echinacea, or exercise reduces the chance of developing a cold. However, it is always recommended to get plenty of rest, exercise, and practice good nutrition. Contact a health care provider if:  You are getting worse rather than better.  Your symptoms are not controlled by medicine.  You have chills.  You have worsening shortness of breath.  You have  brown or red mucus.  You have yellow or brown nasal discharge.  You have pain in your face, especially when you bend forward.  You have a fever.  You have swollen neck glands.  You have pain while swallowing.  You have white areas in the back of your throat. Get help right away if:  You have severe or persistent: ? Headache. ? Ear pain. ? Sinus pain. ? Chest pain.  You have chronic lung disease and any of the following: ? Wheezing. ? Prolonged cough. ? Coughing up blood. ? A change in your usual mucus.  You have a stiff neck.  You have changes in your: ? Vision. ? Hearing. ? Thinking. ? Mood. This information is not intended to replace advice given to you by your health care provider. Make sure you discuss any questions you have with your health care provider. Document Released: 11/27/2000 Document Revised: 02/04/2016 Document Reviewed: 09/08/2013 Elsevier Interactive Patient Education  2017 [employer]  

## 2016-08-12 ENCOUNTER — Other Ambulatory Visit: Payer: Self-pay | Admitting: Physician Assistant

## 2016-08-19 ENCOUNTER — Ambulatory Visit: Payer: Self-pay | Admitting: Physician Assistant

## 2016-08-19 ENCOUNTER — Encounter: Payer: Self-pay | Admitting: Physician Assistant

## 2016-08-19 VITALS — BP 140/90 | HR 80 | Temp 97.9°F | Ht 64.25 in | Wt 348.0 lb

## 2016-08-19 DIAGNOSIS — I10 Essential (primary) hypertension: Secondary | ICD-10-CM

## 2016-08-19 MED ORDER — HYDROCHLOROTHIAZIDE 12.5 MG PO CAPS: 12.5000 mg | ORAL_CAPSULE | Freq: Every day | ORAL | 2 refills | Status: DC

## 2016-08-19 NOTE — Progress Notes (Signed)
BP 140/90 (BP Location: Left Arm, Patient Position: Sitting, Cuff Size: Large)   Pulse 80   Temp 97.9 F (36.6 C)   Ht 5' 4.25" (1.632 m)   Wt (!) 348 lb (157.9 kg)   SpO2 99%   BMI 59.27 kg/m    Subjective:    Patient ID: Elizabeth Hendrix, female    DOB: 1981-02-06, 36 y.o.   MRN: [id number]  HPI: Elizabeth Hendrix is a 36 y.o. female presenting on 08/19/2016 for Hypertension   HPI   Pt supposed to get PAP today but says she is on her cycle.    No complaints today  Relevant past medical, surgical, family and social history reviewed and updated as indicated. Interim medical history since our last visit reviewed. Allergies and medications reviewed and updated.   Current Outpatient Prescriptions:  .  amLODipine (NORVASC) 10 MG tablet, TAKE ONE TABLET BY MOUTH ONCE DAILY FOR  BLOOD  PRESSURE, Disp: 30 tablet, Rfl: 0   Review of Systems  Constitutional: Negative for appetite change, chills, diaphoresis, fatigue, fever and unexpected weight change.  HENT: Negative for congestion, dental problem, drooling, ear pain, facial swelling, hearing loss, mouth sores, sneezing, sore throat, trouble swallowing and voice change.   Eyes: Negative for pain, discharge, redness, itching and visual disturbance.  Respiratory: Negative for cough, choking, shortness of breath and wheezing.   Cardiovascular: Negative for chest pain, palpitations and leg swelling.  Gastrointestinal: Negative for abdominal pain, blood in stool, constipation, diarrhea and vomiting.  Endocrine: Negative for cold intolerance, heat intolerance and polydipsia.  Genitourinary: Negative for decreased urine volume, dysuria and hematuria.  Musculoskeletal: Negative for arthralgias, back pain and gait problem.  Skin: Negative for rash.  Allergic/Immunologic: Negative for environmental allergies.  Neurological: Negative for seizures, syncope, light-headedness and headaches.  Hematological: Negative for adenopathy.   Psychiatric/Behavioral: Negative for agitation, dysphoric mood and suicidal ideas. The patient is not nervous/anxious.     Per HPI unless specifically indicated above     Objective:    BP 140/90 (BP Location: Left Arm, Patient Position: Sitting, Cuff Size: Large)   Pulse 80   Temp 97.9 F (36.6 C)   Ht 5' 4.25" (1.632 m)   Wt (!) 348 lb (157.9 kg)   SpO2 99%   BMI 59.27 kg/m   Wt Readings from Last 3 Encounters:  08/19/16 (!) 348 lb (157.9 kg)  07/15/16 (!) 341 lb 12 oz (155 kg)  03/14/16 (!) 341 lb 8 oz (154.9 kg)    Physical Exam  Constitutional: She is oriented to person, place, and time. She appears well-developed and well-nourished.  HENT:  Head: Normocephalic and atraumatic.  Neck: Neck supple.  Cardiovascular: Normal rate and regular rhythm.   Pulmonary/Chest: Effort normal and breath sounds normal.  Abdominal: Soft. Bowel sounds are normal. She exhibits no mass. There is no hepatosplenomegaly. There is no tenderness.  Musculoskeletal: She exhibits no edema.  Lymphadenopathy:    She has no cervical adenopathy.  Neurological: She is alert and oriented to person, place, and time.  Skin: Skin is warm and dry.  Psychiatric: She has a normal mood and affect. Her behavior is normal.  Vitals reviewed.   Results for orders placed or performed in visit on 03/14/16  Basic Metabolic Panel (BMET)  Result Value Ref Range   Sodium 139 135 - 146 mmol/L   Potassium 4.4 3.5 - 5.3 mmol/L   Chloride 104 98 - 110 mmol/L   CO2 26 20 - 31 mmol/L  Glucose, Bld 96 65 - 99 mg/dL   BUN 10 7 - 25 mg/dL   Creat 1.610.56 0.960.50 - 0.451.10 mg/dL   Calcium 9.1 8.6 - 40.910.2 mg/dL      Assessment & Plan:    Encounter Diagnoses  Name Primary?  . Essential hypertension, benign Yes  . Morbid obesity, unspecified obesity type (HCC)     -Add hctz 12.5 daily.   Check bmet before next OV -F/u 6 weeks to recheck BP and PAP at that appointment.

## 2016-09-12 ENCOUNTER — Other Ambulatory Visit: Payer: Self-pay | Admitting: Physician Assistant

## 2016-09-23 ENCOUNTER — Other Ambulatory Visit: Payer: Self-pay

## 2016-09-23 DIAGNOSIS — I10 Essential (primary) hypertension: Secondary | ICD-10-CM

## 2016-09-26 LAB — BASIC METABOLIC PANEL
BUN: 11 mg/dL (ref 7–25)
CHLORIDE: 104 mmol/L (ref 98–110)
CO2: 28 mmol/L (ref 20–31)
Calcium: 8.9 mg/dL (ref 8.6–10.2)
Creat: 0.54 mg/dL (ref 0.50–1.10)
Glucose, Bld: 97 mg/dL (ref 65–99)
POTASSIUM: 4 mmol/L (ref 3.5–5.3)
Sodium: 139 mmol/L (ref 135–146)

## 2016-10-03 ENCOUNTER — Ambulatory Visit: Payer: Self-pay | Admitting: Physician Assistant

## 2016-10-03 ENCOUNTER — Encounter: Payer: Self-pay | Admitting: Physician Assistant

## 2016-10-03 ENCOUNTER — Other Ambulatory Visit: Payer: Self-pay | Admitting: Physician Assistant

## 2016-10-03 VITALS — BP 144/88 | HR 76 | Temp 98.1°F | Ht 64.25 in | Wt 349.0 lb

## 2016-10-03 DIAGNOSIS — Z124 Encounter for screening for malignant neoplasm of cervix: Secondary | ICD-10-CM

## 2016-10-03 DIAGNOSIS — I10 Essential (primary) hypertension: Secondary | ICD-10-CM

## 2016-10-03 MED ORDER — HYDROCHLOROTHIAZIDE 25 MG PO TABS: 25.0000 mg | ORAL_TABLET | Freq: Every day | ORAL | 1 refills | Status: DC

## 2016-10-03 NOTE — Progress Notes (Signed)
BP (!) 144/88 (BP Location: Left Arm, Patient Position: Sitting, Cuff Size: Large)   Pulse 76   Temp 98.1 F (36.7 C)   Ht 5' 4.25" (1.632 m)   Wt (!) 349 lb (158.3 kg)   SpO2 99%   BMI 59.44 kg/m    Subjective:    Patient ID: Elizabeth Hendrix, female    DOB: [birth year], 36 y.o.   MRN: [id number]  HPI: Elizabeth Hendrix is a 36 y.o. female presenting on 10/03/2016 for Hypertension and Gynecologic Exam   HPI  Relevant past medical, surgical, family and social history reviewed and updated as indicated. Interim medical history since our last visit reviewed. Allergies and medications reviewed and updated.   Current Outpatient Prescriptions:  .  amLODipine (NORVASC) 10 MG tablet, TAKE 1 TABLET BY MOUTH ONCE DAILY FOR BLOOD PRESSURE, Disp: 30 tablet, Rfl: 1 .  hydrochlorothiazide (MICROZIDE) 12.5 MG capsule, Take 1 capsule (12.5 mg total) by mouth daily., Disp: 30 capsule, Rfl: 2   Review of Systems  Constitutional: Negative for appetite change, chills, diaphoresis, fatigue, fever and unexpected weight change.  HENT: Negative for congestion, dental problem, drooling, ear pain, facial swelling, hearing loss, mouth sores, sneezing, sore throat, trouble swallowing and voice change.   Eyes: Negative for pain, discharge, redness, itching and visual disturbance.  Respiratory: Negative for cough, choking, shortness of breath and wheezing.   Cardiovascular: Negative for chest pain, palpitations and leg swelling.  Gastrointestinal: Negative for abdominal pain, blood in stool, constipation, diarrhea and vomiting.  Endocrine: Negative for cold intolerance, heat intolerance and polydipsia.  Genitourinary: Negative for decreased urine volume, dysuria and hematuria.  Musculoskeletal: Negative for arthralgias, back pain and gait problem.  Skin: Negative for rash.  Allergic/Immunologic: Negative for environmental allergies.  Neurological: Negative for seizures, syncope,  light-headedness and headaches.  Hematological: Negative for adenopathy.  Psychiatric/Behavioral: Negative for agitation, dysphoric mood and suicidal ideas. The patient is not nervous/anxious.     Per HPI unless specifically indicated above     Objective:    BP (!) 144/88 (BP Location: Left Arm, Patient Position: Sitting, Cuff Size: Large)   Pulse 76   Temp 98.1 F (36.7 C)   Ht 5' 4.25" (1.632 m)   Wt (!) 349 lb (158.3 kg)   SpO2 99%   BMI 59.44 kg/m   Wt Readings from Last 3 Encounters:  10/03/16 (!) 349 lb (158.3 kg)  08/19/16 (!) 348 lb (157.9 kg)  07/15/16 (!) 341 lb 12 oz (155 kg)    Physical Exam  Constitutional: She is oriented to person, place, and time. She appears well-developed and well-nourished.  Pulmonary/Chest: Effort normal.  Breast exam normal  Abdominal: Soft. She exhibits no mass. There is no tenderness. There is no rebound and no guarding.  Genitourinary: Vagina normal and uterus normal. No breast swelling, tenderness, discharge or bleeding. There is no rash, tenderness or lesion on the right labia. There is no rash, tenderness or lesion on the left labia. Cervix exhibits no motion tenderness, no discharge and no friability. Right adnexum displays no mass, no tenderness and no fullness. Left adnexum displays no mass, no tenderness and no fullness.  Genitourinary Comments: (nurse Berenice assisted)  Neurological: She is alert and oriented to person, place, and time.  Skin: Skin is warm and dry.  Psychiatric: She has a normal mood and affect. Her behavior is normal.  Nursing note and vitals reviewed.   Results for orders placed or performed in visit on 09/23/16  Basic Metabolic  Panel (BMET)  Result Value Ref Range   Sodium 139 135 - 146 mmol/L   Potassium 4.0 3.5 - 5.3 mmol/L   Chloride 104 98 - 110 mmol/L   CO2 28 20 - 31 mmol/L   Glucose, Bld 97 65 - 99 mg/dL   BUN 11 7 - 25 mg/dL   Creat 1.61 0.96 - 0.45 mg/dL   Calcium 8.9 8.6 - 40.9 mg/dL       Assessment & Plan:   Encounter Diagnoses  Name Primary?  . Routine Papanicolaou smear Yes  . Essential hypertension, benign   . Morbid obesity, unspecified obesity type (HCC)     -reviewed labs with pt -Increase hctz to  -f/u 1 month to recheck the bp

## 2016-10-04 LAB — PAP, THINPREP RFLX HPV

## 2016-11-05 ENCOUNTER — Ambulatory Visit: Payer: Self-pay | Admitting: Physician Assistant

## 2016-11-05 ENCOUNTER — Encounter: Payer: Self-pay | Admitting: Physician Assistant

## 2016-11-05 VITALS — BP 132/89 | HR 69

## 2016-11-05 DIAGNOSIS — I10 Essential (primary) hypertension: Secondary | ICD-10-CM

## 2016-11-05 NOTE — Progress Notes (Signed)
   BP 132/89   Pulse 69   SpO2 98%    Subjective:    Patient ID: Elizabeth Hendrix, female    DOB: 07/07/1980, 36 y.o.   MRN: [id number]  HPI: Elizabeth Hendrix is a 36 y.o. female presenting on 11/05/2016 for Hypertension   HPI   Pt doing well  Relevant past medical, surgical, family and social history reviewed and updated as indicated. Interim medical history since our last visit reviewed. Allergies and medications reviewed and updated.   Current Outpatient Prescriptions:  .  amLODipine (NORVASC) 10 MG tablet, TAKE 1 TABLET BY MOUTH ONCE DAILY FOR BLOOD PRESSURE, Disp: 30 tablet, Rfl: 1 .  hydrochlorothiazide (HYDRODIURIL) 25 MG tablet, Take 1 tablet (25 mg total) by mouth daily., Disp: 30 tablet, Rfl: 1   Review of Systems  Constitutional: Negative for appetite change, chills, diaphoresis, fatigue, fever and unexpected weight change.  HENT: Negative for congestion, dental problem, drooling, ear pain, facial swelling, hearing loss, mouth sores, sneezing, sore throat, trouble swallowing and voice change.   Eyes: Negative for pain, discharge, redness, itching and visual disturbance.  Respiratory: Negative for cough, choking, shortness of breath and wheezing.   Cardiovascular: Negative for chest pain, palpitations and leg swelling.  Gastrointestinal: Negative for abdominal pain, blood in stool, constipation, diarrhea and vomiting.  Endocrine: Negative for cold intolerance, heat intolerance and polydipsia.  Genitourinary: Negative for decreased urine volume, dysuria and hematuria.  Musculoskeletal: Negative for arthralgias, back pain and gait problem.  Skin: Negative for rash.  Allergic/Immunologic: Negative for environmental allergies.  Neurological: Negative for seizures, syncope, light-headedness and headaches.  Hematological: Negative for adenopathy.  Psychiatric/Behavioral: Negative for agitation, dysphoric mood and suicidal ideas. The patient is not nervous/anxious.      Per HPI unless specifically indicated above     Objective:    BP 132/89   Pulse 69   SpO2 98%   Wt Readings from Last 3 Encounters:  10/03/16 (!) 349 lb (158.3 kg)  08/19/16 (!) 348 lb (157.9 kg)  07/15/16 (!) 341 lb 12 oz (155 kg)    Physical Exam  Constitutional: She is oriented to person, place, and time. She appears well-developed and well-nourished.  HENT:  Head: Normocephalic and atraumatic.  Neck: Neck supple.  Cardiovascular: Normal rate and regular rhythm.   Pulmonary/Chest: Effort normal and breath sounds normal.  Abdominal: Soft. Bowel sounds are normal. She exhibits no mass. There is no hepatosplenomegaly. There is no tenderness.  Musculoskeletal: She exhibits no edema.  Lymphadenopathy:    She has no cervical adenopathy.  Neurological: She is alert and oriented to person, place, and time.  Skin: Skin is warm and dry.  Psychiatric: She has a normal mood and affect. Her behavior is normal.  Vitals reviewed.       Assessment & Plan:    Encounter Diagnosis  Name Primary?  . Essential hypertension, benign Yes     -Continue current medications -Follow up 3 months.  RTO sooner prn. Check bmp before appt

## 2016-11-14 ENCOUNTER — Other Ambulatory Visit: Payer: Self-pay | Admitting: Physician Assistant

## 2016-11-28 ENCOUNTER — Other Ambulatory Visit: Payer: Self-pay | Admitting: Physician Assistant

## 2016-12-12 ENCOUNTER — Other Ambulatory Visit: Payer: Self-pay | Admitting: Physician Assistant

## 2016-12-12 DIAGNOSIS — I10 Essential (primary) hypertension: Secondary | ICD-10-CM

## 2017-01-30 ENCOUNTER — Other Ambulatory Visit (HOSPITAL_COMMUNITY)
Admission: RE | Admit: 2017-01-30 | Discharge: 2017-01-30 | Disposition: A | Discharge status: Home / Self Care | Payer: Self-pay | Source: Ambulatory Visit | Attending: Physician Assistant | Admitting: Physician Assistant

## 2017-01-30 DIAGNOSIS — R69 Illness, unspecified: Secondary | ICD-10-CM | POA: Insufficient documentation

## 2017-02-04 ENCOUNTER — Other Ambulatory Visit (HOSPITAL_COMMUNITY)
Admission: RE | Admit: 2017-02-04 | Discharge: 2017-02-04 | Disposition: A | Discharge status: Home / Self Care | Payer: Self-pay | Source: Ambulatory Visit | Attending: Physician Assistant | Admitting: Physician Assistant

## 2017-02-04 ENCOUNTER — Encounter: Payer: Self-pay | Admitting: Physician Assistant

## 2017-02-04 ENCOUNTER — Ambulatory Visit: Payer: Self-pay | Admitting: Physician Assistant

## 2017-02-04 VITALS — BP 134/80 | HR 86 | Temp 98.1°F | Wt 350.0 lb

## 2017-02-04 DIAGNOSIS — I10 Essential (primary) hypertension: Secondary | ICD-10-CM | POA: Insufficient documentation

## 2017-02-04 DIAGNOSIS — Z131 Encounter for screening for diabetes mellitus: Secondary | ICD-10-CM

## 2017-02-04 LAB — BASIC METABOLIC PANEL
ANION GAP: 8 (ref 5–15)
BUN: 12 mg/dL (ref 6–20)
CHLORIDE: 100 mmol/L — AB (ref 101–111)
CO2: 30 mmol/L (ref 22–32)
CREATININE: 0.65 mg/dL (ref 0.44–1.00)
Calcium: 9 mg/dL (ref 8.9–10.3)
GFR calc non Af Amer: >60 mL/min (ref >60)
Glucose, Bld: 104 mg/dL — ABNORMAL HIGH (ref 65–99)
POTASSIUM: 3.6 mmol/L (ref 3.5–5.1)
SODIUM: 138 mmol/L (ref 135–145)

## 2017-02-04 MED ORDER — ATENOLOL 25 MG PO TABS: 25.0000 mg | ORAL_TABLET | Freq: Every day | ORAL | 3 refills | Status: DC

## 2017-02-04 NOTE — Progress Notes (Signed)
BP 134/80   Pulse 86   Temp 98.1 F (36.7 C)   Wt (!) 350 lb (158.8 kg)   SpO2 98%   BMI 59.61 kg/m    Subjective:    Patient ID: Elizabeth Hendrix, female    DOB: [birth year], 36 y.o.   MRN: [id number]  HPI: Elizabeth Hendrix is a 36 y.o. female presenting on 02/04/2017 for Hypertension   HPI   Pt went to lab and left after waiting for .  She did not get the labs drawn as instructed.   She says she still has lots of stress, hates her job but "has to have it".  Denies depression.    Relevant past medical, surgical, family and social history reviewed and updated as indicated. Interim medical history since our last visit reviewed. Allergies and medications reviewed and updated.   Current Outpatient Prescriptions:  .  amLODipine (NORVASC) 10 MG tablet, TAKE 1 TABLET BY MOUTH ONCE DAILY FOR  BLOOD  PRESSURE, Disp: 30 tablet, Rfl: 3 .  hydrochlorothiazide (HYDRODIURIL) 25 MG tablet, TAKE 1 TABLET BY MOUTH ONCE DAILY, Disp: 30 tablet, Rfl: 3  Review of Systems  Constitutional: Negative for appetite change, chills, diaphoresis, fatigue, fever and unexpected weight change.  HENT: Negative for congestion, dental problem, drooling, ear pain, facial swelling, hearing loss, mouth sores, sneezing, sore throat, trouble swallowing and voice change.   Eyes: Negative for pain, discharge, redness, itching and visual disturbance.  Respiratory: Negative for cough, choking, shortness of breath and wheezing.   Cardiovascular: Negative for chest pain, palpitations and leg swelling.  Gastrointestinal: Negative for abdominal pain, blood in stool, constipation, diarrhea and vomiting.  Endocrine: Negative for cold intolerance, heat intolerance and polydipsia.  Genitourinary: Negative for decreased urine volume, dysuria and hematuria.  Musculoskeletal: Negative for arthralgias, back pain and gait problem.  Skin: Negative for rash.  Allergic/Immunologic: Negative for environmental  allergies.  Neurological: Negative for seizures, syncope, light-headedness and headaches.  Hematological: Negative for adenopathy.  Psychiatric/Behavioral: Negative for agitation, dysphoric mood and suicidal ideas. The patient is not nervous/anxious.     Per HPI unless specifically indicated above     Objective:    BP 134/80   Pulse 86   Temp 98.1 F (36.7 C)   Wt (!) 350 lb (158.8 kg)   SpO2 98%   BMI 59.61 kg/m   Wt Readings from Last 3 Encounters:  02/04/17 (!) 350 lb (158.8 kg)  10/03/16 (!) 349 lb (158.3 kg)  08/19/16 (!) 348 lb (157.9 kg)    11/05/16 132/89  10/03/16 144/88 08/19/16  140/90 07/15/16 134/100 03/14/16 134/96    Physical Exam  Constitutional: She is oriented to person, place, and time. She appears well-developed and well-nourished.  HENT:  Head: Normocephalic and atraumatic.  Neck: Neck supple.  Cardiovascular: Normal rate and regular rhythm.   Pulmonary/Chest: Effort normal and breath sounds normal.  Abdominal: Soft. Bowel sounds are normal. She exhibits no mass. There is no hepatosplenomegaly. There is no tenderness.  Musculoskeletal: She exhibits no edema.  Lymphadenopathy:    She has no cervical adenopathy.  Neurological: She is alert and oriented to person, place, and time.  Skin: Skin is warm and dry.  Psychiatric: She has a normal mood and affect. Her behavior is normal.  Vitals reviewed.      Assessment & Plan:   Encounter Diagnoses  Name Primary?  . Essential hypertension, benign Yes  . Morbid obesity, unspecified obesity type (HCC)   . Screening for diabetes mellitus      -  will add atenolol for bp that is still a bit too high -pt counseled on diet and exercise which will help her weight, her bp and her anxiety/stress levels -pt urged to get her blood drawn today when she leaves office.  Discussed that it is very important  -pt to follow up for bp recheck in 1 month. RTO sooner prn

## 2017-02-06 LAB — HEMOGLOBIN A1C
HEMOGLOBIN A1C: 4.8 % (ref 4.8–5.6)
MEAN PLASMA GLUCOSE: 91 mg/dL

## 2017-03-06 ENCOUNTER — Ambulatory Visit: Payer: Self-pay | Admitting: Physician Assistant

## 2017-03-06 ENCOUNTER — Encounter: Payer: Self-pay | Admitting: Physician Assistant

## 2017-03-06 VITALS — BP 118/66 | HR 67 | Temp 97.7°F | Wt 349.8 lb

## 2017-03-06 DIAGNOSIS — I10 Essential (primary) hypertension: Secondary | ICD-10-CM

## 2017-03-06 NOTE — Progress Notes (Signed)
BP 118/66 (BP Location: Left Arm, Patient Position: Sitting, Cuff Size: Large)   Pulse 67   Temp 97.7 F (36.5 C)   Wt (!) 349 lb 12 oz (158.6 kg)   SpO2 97%   BMI 59.57 kg/m    Subjective:    Patient ID: Elizabeth Hendrix, female    DOB: 04-17-1981, 36 y.o.   MRN: [id number]  HPI: Elizabeth Hendrix is a 36 y.o. female presenting on 03/06/2017 for No chief complaint on file.   HPI   Pt doing well. No complaints today  Relevant past medical, surgical, family and social history reviewed and updated as indicated. Interim medical history since our last visit reviewed. Allergies and medications reviewed and updated.   Current Outpatient Prescriptions:  .  amLODipine (NORVASC) 10 MG tablet, TAKE 1 TABLET BY MOUTH ONCE DAILY FOR  BLOOD  PRESSURE, Disp: 30 tablet, Rfl: 3 .  atenolol (TENORMIN) 25 MG tablet, Take 1 tablet (25 mg total) by mouth daily., Disp: 30 tablet, Rfl: 3 .  hydrochlorothiazide (HYDRODIURIL) 25 MG tablet, TAKE 1 TABLET BY MOUTH ONCE DAILY, Disp: 30 tablet, Rfl: 3   Review of Systems  Constitutional: Negative for appetite change, chills, diaphoresis, fatigue, fever and unexpected weight change.  HENT: Negative for congestion, dental problem, drooling, ear pain, facial swelling, hearing loss, mouth sores, sneezing, sore throat, trouble swallowing and voice change.   Eyes: Negative for pain, discharge, redness, itching and visual disturbance.  Respiratory: Negative for cough, choking, shortness of breath and wheezing.   Cardiovascular: Negative for chest pain, palpitations and leg swelling.  Gastrointestinal: Negative for abdominal pain, blood in stool, constipation, diarrhea and vomiting.  Endocrine: Negative for cold intolerance, heat intolerance and polydipsia.  Genitourinary: Negative for decreased urine volume, dysuria and hematuria.  Musculoskeletal: Negative for arthralgias, back pain and gait problem.  Skin: Negative for rash.   Allergic/Immunologic: Negative for environmental allergies.  Neurological: Negative for seizures, syncope, light-headedness and headaches.  Hematological: Negative for adenopathy.  Psychiatric/Behavioral: Negative for agitation, dysphoric mood and suicidal ideas. The patient is not nervous/anxious.     Per HPI unless specifically indicated above     Objective:    BP 118/66 (BP Location: Left Arm, Patient Position: Sitting, Cuff Size: Large)   Pulse 67   Temp 97.7 F (36.5 C)   Wt (!) 349 lb 12 oz (158.6 kg)   SpO2 97%   BMI 59.57 kg/m   Wt Readings from Last 3 Encounters:  03/06/17 (!) 349 lb 12 oz (158.6 kg)  02/04/17 (!) 350 lb (158.8 kg)  10/03/16 (!) 349 lb (158.3 kg)    Physical Exam  Constitutional: She is oriented to person, place, and time. She appears well-developed and well-nourished.  HENT:  Head: Normocephalic and atraumatic.  Neck: Neck supple.  Cardiovascular: Normal rate and regular rhythm.   Pulmonary/Chest: Effort normal and breath sounds normal.  Abdominal: Soft. Bowel sounds are normal. She exhibits no mass. There is no hepatosplenomegaly. There is no tenderness.  Musculoskeletal: She exhibits no edema.  Lymphadenopathy:    She has no cervical adenopathy.  Neurological: She is alert and oriented to person, place, and time.  Skin: Skin is warm and dry.  Psychiatric: She has a normal mood and affect. Her behavior is normal.  Vitals reviewed.   Results for orders placed or performed during the hospital encounter of 02/04/17  Basic Metabolic Panel (BMET)  Result Value Ref Range   Sodium 138 135 - 145 mmol/L   Potassium 3.6  3.5 - 5.1 mmol/L   Chloride 100 (L) 101 - 111 mmol/L   CO2 30 22 - 32 mmol/L   Glucose, Bld 104 (H) 65 - 99 mg/dL   BUN 12 6 - 20 mg/dL   Creatinine, Ser 5.95 0.44 - 1.00 mg/dL   Calcium 9.0 8.9 - 63.8 mg/dL   GFR calc non Af Amer >60 >60 mL/min   GFR calc Af Amer >60 >60 mL/min   Anion gap 8 5 - 15  HgB A1c  Result Value Ref  Range   Hgb A1c MFr Bld 4.8 4.8 - 5.6 %   Mean Plasma Glucose 91 mg/dL      Assessment & Plan:    Encounter Diagnoses  Name Primary?  . Essential hypertension, benign Yes  . Morbid obesity, unspecified obesity type (HCC)     -Reviewed labs with pt -bp good.   -Pt to continue current rx -Follow up 3 months. RTO sooner prn

## 2017-03-12 ENCOUNTER — Other Ambulatory Visit: Payer: Self-pay | Admitting: Physician Assistant

## 2017-04-02 ENCOUNTER — Other Ambulatory Visit: Payer: Self-pay | Admitting: Physician Assistant

## 2017-04-02 MED ORDER — HYDROCHLOROTHIAZIDE 25 MG PO TABS: 25.0000 mg | ORAL_TABLET | Freq: Every day | ORAL | 3 refills | Status: DC

## 2017-05-29 ENCOUNTER — Other Ambulatory Visit: Payer: Self-pay | Admitting: Physician Assistant

## 2017-06-05 ENCOUNTER — Encounter: Payer: Self-pay | Admitting: Physician Assistant

## 2017-06-05 ENCOUNTER — Ambulatory Visit: Payer: Self-pay | Admitting: Physician Assistant

## 2017-06-05 ENCOUNTER — Other Ambulatory Visit (HOSPITAL_COMMUNITY)
Admission: RE | Admit: 2017-06-05 | Discharge: 2017-06-05 | Disposition: A | Discharge status: Home / Self Care | Payer: Self-pay | Source: Ambulatory Visit | Attending: Physician Assistant | Admitting: Physician Assistant

## 2017-06-05 VITALS — BP 120/78 | HR 73 | Temp 97.7°F | Wt 348.2 lb

## 2017-06-05 DIAGNOSIS — I10 Essential (primary) hypertension: Secondary | ICD-10-CM

## 2017-06-05 LAB — BASIC METABOLIC PANEL
ANION GAP: 10 (ref 5–15)
BUN: 13 mg/dL (ref 6–20)
CHLORIDE: 102 mmol/L (ref 101–111)
CO2: 27 mmol/L (ref 22–32)
Calcium: 9.3 mg/dL (ref 8.9–10.3)
Creatinine, Ser: 0.62 mg/dL (ref 0.44–1.00)
GFR calc Af Amer: >60 mL/min (ref >60)
GLUCOSE: 114 mg/dL — AB (ref 65–99)
POTASSIUM: 3.6 mmol/L (ref 3.5–5.1)
Sodium: 139 mmol/L (ref 135–145)

## 2017-06-05 NOTE — Progress Notes (Signed)
BP 120/78 (BP Location: Left Arm, Patient Position: Sitting, Cuff Size: Large)   Pulse 73   Temp 97.7 F (36.5 C)   Wt (!) 348 lb 4 oz (158 kg)   SpO2 99%   BMI 59.31 kg/m    Subjective:    Patient ID: Elizabeth Hendrix, female    DOB: 12/25/1980, 36 y.o.   MRN: [id number]  HPI: Elizabeth Hendrix is a 36 y.o. female presenting on 06/05/2017 for Hypertension   HPI   Pt without complaint   Relevant past medical, surgical, family and social history reviewed and updated as indicated. Interim medical history since our last visit reviewed. Allergies and medications reviewed and updated.   Current Outpatient Medications:  .  amLODipine (NORVASC) 10 MG tablet, TAKE 1 TABLET BY MOUTH ONCE DAILY FOR BLOOD PRESSURE, Disp: 30 tablet, Rfl: 3 .  atenolol (TENORMIN) 25 MG tablet, TAKE 1 TABLET BY MOUTH ONCE DAILY, Disp: 30 tablet, Rfl: 1 .  hydrochlorothiazide (HYDRODIURIL) 25 MG tablet, Take 1 tablet (25 mg total) by mouth daily., Disp: 30 tablet, Rfl: 3   Review of Systems  Constitutional: Negative for appetite change, chills, diaphoresis, fatigue, fever and unexpected weight change.  HENT: Negative for congestion, dental problem, drooling, ear pain, facial swelling, hearing loss, mouth sores, sneezing, sore throat, trouble swallowing and voice change.   Eyes: Negative for pain, discharge, redness, itching and visual disturbance.  Respiratory: Negative for cough, choking, shortness of breath and wheezing.   Cardiovascular: Negative for chest pain, palpitations and leg swelling.  Gastrointestinal: Negative for abdominal pain, blood in stool, constipation, diarrhea and vomiting.  Endocrine: Negative for cold intolerance, heat intolerance and polydipsia.  Genitourinary: Negative for decreased urine volume, dysuria and hematuria.  Musculoskeletal: Negative for arthralgias, back pain and gait problem.  Skin: Negative for rash.  Allergic/Immunologic: Negative for environmental  allergies.  Neurological: Negative for seizures, syncope, light-headedness and headaches.  Hematological: Negative for adenopathy.  Psychiatric/Behavioral: Negative for agitation, dysphoric mood and suicidal ideas. The patient is not nervous/anxious.     Per HPI unless specifically indicated above     Objective:    BP 120/78 (BP Location: Left Arm, Patient Position: Sitting, Cuff Size: Large)   Pulse 73   Temp 97.7 F (36.5 C)   Wt (!) 348 lb 4 oz (158 kg)   SpO2 99%   BMI 59.31 kg/m   Wt Readings from Last 3 Encounters:  06/05/17 (!) 348 lb 4 oz (158 kg)  03/06/17 (!) 349 lb 12 oz (158.6 kg)  02/04/17 (!) 350 lb (158.8 kg)    Physical Exam  Constitutional: She is oriented to person, place, and time. She appears well-developed and well-nourished.  HENT:  Head: Normocephalic and atraumatic.  Neck: Neck supple.  Cardiovascular: Normal rate and regular rhythm.  Pulmonary/Chest: Effort normal and breath sounds normal.  Abdominal: Soft. Bowel sounds are normal. She exhibits no mass. There is no hepatosplenomegaly. There is no tenderness.  Musculoskeletal: She exhibits no edema.  Lymphadenopathy:    She has no cervical adenopathy.  Neurological: She is alert and oriented to person, place, and time.  Skin: Skin is warm and dry.  Psychiatric: She has a normal mood and affect. Her behavior is normal.  Vitals reviewed.       Assessment & Plan:   Encounter Diagnoses  Name Primary?  . Essential hypertension, benign Yes  . Morbid obesity, unspecified obesity type (HCC)      -continue current medications -check bmp -follow up 4  months.  RTO sooner

## 2017-07-09 ENCOUNTER — Other Ambulatory Visit: Payer: Self-pay | Admitting: Physician Assistant

## 2017-07-31 ENCOUNTER — Other Ambulatory Visit: Payer: Self-pay | Admitting: Physician Assistant

## 2017-09-17 ENCOUNTER — Encounter: Payer: Self-pay | Admitting: Physician Assistant

## 2017-09-17 ENCOUNTER — Ambulatory Visit: Payer: Self-pay | Admitting: Physician Assistant

## 2017-09-17 VITALS — BP 132/79 | HR 75 | Temp 97.7°F | Ht 63.5 in | Wt 353.5 lb

## 2017-09-17 DIAGNOSIS — J019 Acute sinusitis, unspecified: Secondary | ICD-10-CM

## 2017-09-17 MED ORDER — AMOXICILLIN 500 MG PO CAPS: 500.0000 mg | ORAL_CAPSULE | Freq: Three times a day (TID) | ORAL | 0 refills | Status: DC

## 2017-09-17 NOTE — Progress Notes (Signed)
BP 132/79 (BP Location: Right Wrist, Patient Position: Sitting, Cuff Size: Normal)   Pulse 75   Temp 97.7 F (36.5 C) (Other (Comment))   Ht 5' 3.5" (1.613 m)   Wt (!) 353 lb 8 oz (160.3 kg)   LMP 09/03/2017 (Approximate)   SpO2 98%   BMI 61.64 kg/m    Subjective:    Patient ID: Elizabeth Hendrix, female    DOB: 03/10/1981, 37 y.o.   MRN: [id number]  HPI: Elizabeth Hendrix is a 37 y.o. female presenting on 09/17/2017 for Nasal Congestion (c/o sinus headaches and ear pain that progressively gets worse as day goes on, S&S for 2 weeks)   HPI   Chief Complaint  Patient presents with  . Nasal Congestion    c/o sinus headaches and ear pain that progressively gets worse as day goes on, S&S for 2 weeks   Tried sudafed and then stopped sudafed and started guiafenesen.  She takes loratidine.  Relevant past medical, surgical, family and social history reviewed and updated as indicated. Interim medical history since our last visit reviewed. Allergies and medications reviewed and updated.   Current Outpatient Medications:  .  amLODipine (NORVASC) 10 MG tablet, TAKE 1 TABLET BY MOUTH ONCE DAILY FOR BLOOD PRESSURE, Disp: 30 tablet, Rfl: 4 .  atenolol (TENORMIN) 25 MG tablet, TAKE 1 TABLET BY MOUTH ONCE DAILY, Disp: 30 tablet, Rfl: 3 .  hydrochlorothiazide (HYDRODIURIL) 25 MG tablet, TAKE 1 TABLET BY MOUTH ONCE DAILY, Disp: 30 tablet, Rfl: 3 .  loratadine (CLARITIN) 10 MG tablet, Take 10 mg by mouth daily., Disp: , Rfl:    Review of Systems  Constitutional: Negative for appetite change, chills, diaphoresis, fatigue, fever and unexpected weight change.  HENT: Positive for congestion and ear pain. Negative for dental problem, drooling, facial swelling, hearing loss, mouth sores, sneezing, sore throat, trouble swallowing and voice change.   Eyes: Negative for pain, discharge, redness, itching and visual disturbance.  Respiratory: Positive for cough. Negative for choking, shortness  of breath and wheezing.   Cardiovascular: Negative for chest pain, palpitations and leg swelling.  Gastrointestinal: Negative for abdominal pain, blood in stool, constipation, diarrhea and vomiting.  Endocrine: Negative for cold intolerance, heat intolerance and polydipsia.  Genitourinary: Negative for decreased urine volume, dysuria and hematuria.  Musculoskeletal: Negative for arthralgias, back pain and gait problem.  Skin: Negative for rash.  Allergic/Immunologic: Negative for environmental allergies.  Neurological: Positive for headaches. Negative for seizures, syncope and light-headedness.  Hematological: Negative for adenopathy.  Psychiatric/Behavioral: Negative for agitation, dysphoric mood and suicidal ideas. The patient is not nervous/anxious.     Per HPI unless specifically indicated above     Objective:    BP 132/79 (BP Location: Right Wrist, Patient Position: Sitting, Cuff Size: Normal)   Pulse 75   Temp 97.7 F (36.5 C) (Other (Comment))   Ht 5' 3.5" (1.613 m)   Wt (!) 353 lb 8 oz (160.3 kg)   LMP 09/03/2017 (Approximate)   SpO2 98%   BMI 61.64 kg/m   Wt Readings from Last 3 Encounters:  09/17/17 (!) 353 lb 8 oz (160.3 kg)  06/05/17 (!) 348 lb 4 oz (158 kg)  03/06/17 (!) 349 lb 12 oz (158.6 kg)    Physical Exam  Constitutional: She is oriented to person, place, and time. She appears well-developed and well-nourished.  HENT:  Head: Normocephalic and atraumatic.  Right Ear: Hearing, tympanic membrane, external ear and ear canal normal.  Left Ear: Hearing, tympanic membrane, external  ear and ear canal normal.  Nose: Mucosal edema and rhinorrhea present. Right sinus exhibits maxillary sinus tenderness. Left sinus exhibits maxillary sinus tenderness.  Mouth/Throat: Uvula is midline and oropharynx is clear and moist. No oropharyngeal exudate.  Neck: Neck supple.  Cardiovascular: Normal rate and regular rhythm.  Pulmonary/Chest: Effort normal and breath sounds normal.  She has no wheezes.  Lymphadenopathy:    She has no cervical adenopathy.  Neurological: She is alert and oriented to person, place, and time.  Skin: Skin is warm and dry.  Psychiatric: She has a normal mood and affect. Her behavior is normal.  Vitals reviewed.       Assessment & Plan:    Encounter Diagnosis  Name Primary?  . Acute sinusitis, recurrence not specified, unspecified location Yes    -rx amoxil.  Continue loratidine.   -follow up as scheduled.  RTO sooner prn worsening or new symptoms

## 2017-10-02 ENCOUNTER — Encounter: Payer: Self-pay | Admitting: Physician Assistant

## 2017-10-02 ENCOUNTER — Other Ambulatory Visit (HOSPITAL_COMMUNITY)
Admission: RE | Admit: 2017-10-02 | Discharge: 2017-10-02 | Disposition: A | Discharge status: Home / Self Care | Payer: Self-pay | Source: Ambulatory Visit | Attending: Physician Assistant | Admitting: Physician Assistant

## 2017-10-02 ENCOUNTER — Ambulatory Visit: Payer: Self-pay | Admitting: Physician Assistant

## 2017-10-02 ENCOUNTER — Other Ambulatory Visit: Payer: Self-pay | Admitting: Physician Assistant

## 2017-10-02 VITALS — BP 127/88 | HR 69 | Temp 97.5°F | Ht 63.5 in | Wt 357.8 lb

## 2017-10-02 DIAGNOSIS — I10 Essential (primary) hypertension: Secondary | ICD-10-CM

## 2017-10-02 DIAGNOSIS — Z13220 Encounter for screening for lipoid disorders: Secondary | ICD-10-CM | POA: Insufficient documentation

## 2017-10-02 DIAGNOSIS — E876 Hypokalemia: Secondary | ICD-10-CM

## 2017-10-02 LAB — COMPREHENSIVE METABOLIC PANEL
ALBUMIN: 3.6 g/dL (ref 3.5–5.0)
ALT: 26 U/L (ref 14–54)
AST: 27 U/L (ref 15–41)
Alkaline Phosphatase: 54 U/L (ref 38–126)
Anion gap: 10 (ref 5–15)
BILIRUBIN TOTAL: 0.6 mg/dL (ref 0.3–1.2)
BUN: 17 mg/dL (ref 6–20)
CHLORIDE: 103 mmol/L (ref 101–111)
CO2: 25 mmol/L (ref 22–32)
CREATININE: 0.58 mg/dL (ref 0.44–1.00)
Calcium: 8.9 mg/dL (ref 8.9–10.3)
GFR calc Af Amer: >60 mL/min (ref >60)
GLUCOSE: 109 mg/dL — AB (ref 65–99)
Potassium: 3.4 mmol/L — ABNORMAL LOW (ref 3.5–5.1)
Sodium: 138 mmol/L (ref 135–145)
TOTAL PROTEIN: 6.9 g/dL (ref 6.5–8.1)

## 2017-10-02 LAB — LIPID PANEL
CHOLESTEROL: 187 mg/dL (ref 0–200)
HDL: 41 mg/dL (ref >40)
LDL Cholesterol: 122 mg/dL — ABNORMAL HIGH (ref 0–99)
TRIGLYCERIDES: 119 mg/dL (ref <150)
Total CHOL/HDL Ratio: 4.6 RATIO
VLDL: 24 mg/dL (ref 0–40)

## 2017-10-02 NOTE — Progress Notes (Signed)
BP 127/88 (BP Location: Right Arm, Patient Position: Sitting, Cuff Size: Normal)   Pulse 69   Temp (!) 97.5 F (36.4 C)   Ht 5' 3.5" (1.613 m)   Wt (!) 357 lb 12 oz (162.3 kg)   LMP 09/03/2017 (Approximate)   SpO2 100%   BMI 62.38 kg/m    Subjective:    Patient ID: Elizabeth Hendrix, female    DOB: December 14, 1980, 37 y.o.   MRN: [id number]  HPI: Elizabeth Hendrix is a 37 y.o. female presenting on 10/02/2017 for Hypertension   HPI Pt is doing well.  No complaints  Relevant past medical, surgical, family and social history reviewed and updated as indicated. Interim medical history since our last visit reviewed. Allergies and medications reviewed and updated.   Current Outpatient Medications:  .  amLODipine (NORVASC) 10 MG tablet, TAKE 1 TABLET BY MOUTH ONCE DAILY FOR BLOOD PRESSURE, Disp: 30 tablet, Rfl: 4 .  atenolol (TENORMIN) 25 MG tablet, TAKE 1 TABLET BY MOUTH ONCE DAILY, Disp: 30 tablet, Rfl: 3 .  hydrochlorothiazide (HYDRODIURIL) 25 MG tablet, TAKE 1 TABLET BY MOUTH ONCE DAILY, Disp: 30 tablet, Rfl: 3 .  loratadine (CLARITIN) 10 MG tablet, Take 10 mg by mouth daily., Disp: , Rfl:  .  ranitidine (ZANTAC) 150 MG tablet, Take 150 mg by mouth at bedtime., Disp: , Rfl:    Review of Systems  Constitutional: Negative for appetite change, chills, diaphoresis, fatigue, fever and unexpected weight change.  HENT: Negative for congestion, dental problem, drooling, ear pain, facial swelling, hearing loss, mouth sores, sneezing, sore throat, trouble swallowing and voice change.   Eyes: Negative for pain, discharge, redness, itching and visual disturbance.  Respiratory: Negative for cough, choking, shortness of breath and wheezing.   Cardiovascular: Negative for chest pain, palpitations and leg swelling.  Gastrointestinal: Negative for abdominal pain, blood in stool, constipation, diarrhea and vomiting.  Endocrine: Negative for cold intolerance, heat intolerance and polydipsia.   Genitourinary: Negative for decreased urine volume, dysuria and hematuria.  Musculoskeletal: Negative for arthralgias, back pain and gait problem.  Skin: Negative for rash.  Allergic/Immunologic: Negative for environmental allergies.  Neurological: Negative for seizures, syncope, light-headedness and headaches.  Hematological: Negative for adenopathy.  Psychiatric/Behavioral: Negative for agitation, dysphoric mood and suicidal ideas. The patient is not nervous/anxious.     Per HPI unless specifically indicated above     Objective:    BP 127/88 (BP Location: Right Arm, Patient Position: Sitting, Cuff Size: Normal)   Pulse 69   Temp (!) 97.5 F (36.4 C)   Ht 5' 3.5" (1.613 m)   Wt (!) 357 lb 12 oz (162.3 kg)   LMP 09/03/2017 (Approximate)   SpO2 100%   BMI 62.38 kg/m   Wt Readings from Last 3 Encounters:  10/02/17 (!) 357 lb 12 oz (162.3 kg)  09/17/17 (!) 353 lb 8 oz (160.3 kg)  06/05/17 (!) 348 lb 4 oz (158 kg)    Physical Exam  Constitutional: She is oriented to person, place, and time. She appears well-developed and well-nourished.  HENT:  Head: Normocephalic and atraumatic.  Neck: Neck supple.  Cardiovascular: Normal rate and regular rhythm.  Pulmonary/Chest: Effort normal and breath sounds normal.  Abdominal: Soft. Bowel sounds are normal. She exhibits no mass. There is no hepatosplenomegaly. There is no tenderness.  Musculoskeletal: She exhibits no edema.  Lymphadenopathy:    She has no cervical adenopathy.  Neurological: She is alert and oriented to person, place, and time.  Skin: Skin is  warm and dry.  Psychiatric: She has a normal mood and affect. Her behavior is normal.  Vitals reviewed.       Assessment & Plan:   Encounter Diagnoses  Name Primary?  . Essential hypertension, benign Yes  . Screening cholesterol level   . Morbid obesity, unspecified obesity type (HCC)     -will check lipids, cmp -pt to continue current medications -pt to follow up in  6 months.  RTO sooner prn

## 2017-11-27 ENCOUNTER — Other Ambulatory Visit: Payer: Self-pay | Admitting: Physician Assistant

## 2017-12-10 ENCOUNTER — Other Ambulatory Visit: Payer: Self-pay | Admitting: Physician Assistant

## 2018-01-17 ENCOUNTER — Encounter (HOSPITAL_COMMUNITY): Payer: Self-pay | Admitting: Emergency Medicine

## 2018-01-17 ENCOUNTER — Other Ambulatory Visit: Payer: Self-pay

## 2018-01-17 ENCOUNTER — Emergency Department (HOSPITAL_COMMUNITY)
Admission: EM | Admit: 2018-01-17 | Discharge: 2018-01-17 | Disposition: A | Discharge status: Home / Self Care | Payer: Self-pay | Attending: Emergency Medicine | Admitting: Emergency Medicine

## 2018-01-17 DIAGNOSIS — Z79899 Other long term (current) drug therapy: Secondary | ICD-10-CM | POA: Insufficient documentation

## 2018-01-17 DIAGNOSIS — I10 Essential (primary) hypertension: Secondary | ICD-10-CM | POA: Insufficient documentation

## 2018-01-17 DIAGNOSIS — L03116 Cellulitis of left lower limb: Secondary | ICD-10-CM | POA: Insufficient documentation

## 2018-01-17 MED ORDER — IBUPROFEN 600 MG PO TABS: 600.0000 mg | ORAL_TABLET | Freq: Four times a day (QID) | ORAL | 0 refills | Status: DC | PRN

## 2018-01-17 MED ORDER — CLINDAMYCIN HCL 150 MG PO CAPS
300.0000 mg | ORAL_CAPSULE | Freq: Once | ORAL | Status: AC
  Administered 2018-01-17: 300 mg via ORAL
  Filled 2018-01-17: qty 2

## 2018-01-17 MED ORDER — CLINDAMYCIN HCL 300 MG PO CAPS: 300.0000 mg | ORAL_CAPSULE | Freq: Four times a day (QID) | ORAL | 0 refills | Status: DC

## 2018-01-17 NOTE — ED Triage Notes (Signed)
Patient c/o left lower leg pain. Per patient she noticed a red area to ankle and back of calf today, some tenderness to touch in areas and hot to touch. Denies any pain with ambulation.

## 2018-01-17 NOTE — ED Provider Notes (Signed)
[facility] EMERGENCY DEPARTMENT Provider Note   CSN: [id number] Arrival date & time: 01/17/18  1635     History   Chief Complaint Chief Complaint  Patient presents with  . Leg Pain    HPI Elizabeth Hendrix is a [age] y.o. female.  HPI Patient states she noticed redness to her left ankle radiating up her lateral calf today.  Denies any fever or chills.  Has some tenderness to the area.  No known insect bites or trauma. Past Medical History:  Diagnosis Date  . Encounter for Essure implantation   . Hypertension     Patient Active Problem List   Diagnosis Date Noted  . Essential hypertension, benign 06/29/2015  . Morbid obesity (HCC) 06/29/2015    History reviewed. No pertinent surgical history.   OB History   None      Home Medications    Prior to Admission medications   Medication Sig Start Date End Date Taking? Authorizing Provider  amLODipine (NORVASC) 10 MG tablet TAKE 1 TABLET BY MOUTH ONCE DAILY FOR BLOOD PRESSURE 12/10/17  Yes Jacquelin HawkingMcElroy, Shannon, PA-C  atenolol (TENORMIN) 25 MG tablet TAKE 1 TABLET BY MOUTH ONCE DAILY 11/29/17  Yes Jacquelin HawkingMcElroy, Shannon, PA-C  hydrochlorothiazide (HYDRODIURIL) 25 MG tablet TAKE 1 TABLET BY MOUTH ONCE DAILY 11/29/17  Yes Jacquelin HawkingMcElroy, Shannon, PA-C  loratadine (CLARITIN) 10 MG tablet Take 10 mg by mouth daily.   Yes [provider]  naproxen sodium (ALEVE) 220 MG tablet Take 220 mg by mouth daily as needed (for pain).   Yes [provider]  ranitidine (ZANTAC) 150 MG tablet Take 150 mg by mouth at bedtime.   Yes [provider]  clindamycin (CLEOCIN) 300 MG capsule Take 1 capsule (300 mg total) by mouth 4 (four) times daily. X 7 days 01/17/18   Loren RacerYelverton, Ladawn Boullion, MD  ibuprofen (ADVIL,MOTRIN) 600 MG tablet Take 1 tablet (600 mg total) by mouth every 6 (six) hours as needed. 01/17/18   Loren RacerYelverton, Joscelyne Renville, MD    Family History History reviewed. No pertinent family history.  Social History Social History   Tobacco  Use  . Smoking status: Never Smoker  . Smokeless tobacco: Never Used  Substance Use Topics  . Alcohol use: No  . Drug use: No     Allergies   Patient has no known allergies.   Review of Systems Review of Systems  Constitutional: Negative for chills and fever.  Musculoskeletal: Positive for myalgias.  Skin: Positive for color change and rash. Negative for wound.  Neurological: Negative for weakness and numbness.  All other systems reviewed and are negative.    Physical Exam Updated Vital Signs BP (!) 173/90 (BP Location: Right Wrist)   Pulse 78   Temp 98.5 F (36.9 C) (Oral)   Resp 18   Ht 5\' 3"  (1.6 m)   Wt (!) 170.1 kg (375 lb)   LMP 01/12/2018   SpO2 99%   BMI 66.43 kg/m   Physical Exam  Constitutional: She is oriented to person, place, and time. She appears well-developed and well-nourished.  HENT:  Head: Normocephalic and atraumatic.  Eyes: Pupils are equal, round, and reactive to light. EOM are normal.  Neck: Normal range of motion. Neck supple.  Cardiovascular: Normal rate.  Pulmonary/Chest: Effort normal.  Abdominal: Soft.  Musculoskeletal: Normal range of motion. She exhibits tenderness. She exhibits no edema.  Circumferential redness to the left ankle with induration in the posterior ankle.  No fluctuant masses.  No open wounds.  Patient does have  some tracking up the lateral surface of the lateral calf.  No masses.  No obvious calf swelling or asymmetry.  Distal pulses are 2+.  Neurological: She is alert and oriented to person, place, and time.  Moves all extremity's without focal deficit.  Sensation intact.  Skin: Skin is warm and dry. No rash noted. There is erythema.  Psychiatric: She has a normal mood and affect. Her behavior is normal.  Nursing note and vitals reviewed.    ED Treatments / Results  Labs (all labs ordered are listed, but only abnormal results are displayed) Labs Reviewed - No data to display  EKG None  Radiology No results  found.  Procedures Procedures (including critical care time)  Medications Ordered in ED Medications  clindamycin (CLEOCIN) capsule 300 mg (has no administration in time range)     Initial Impression / Assessment and Plan / ED Course  I have reviewed the triage vital signs and the nursing notes.  Pertinent labs & imaging results that were available during my care of the patient were reviewed by me and considered in my medical decision making (see chart for details).     Suspect left ankle cellulitis with lymphatic tracking.  Will start on antibiotics.  Patient understands the need to return to the emergency department if symptoms worsen or if there is no improvement in 2 days.  Final Clinical Impressions(s) / ED Diagnoses   Final diagnoses:  Cellulitis of left leg    ED Discharge Orders        Ordered    clindamycin (CLEOCIN) 300 MG capsule  4 times daily     01/17/18 1704    ibuprofen (ADVIL,MOTRIN) 600 MG tablet  Every 6 hours PRN     01/17/18 1704       Loren Racer, MD 01/17/18 1709

## 2018-01-26 ENCOUNTER — Ambulatory Visit: Payer: Self-pay | Admitting: Physician Assistant

## 2018-01-26 ENCOUNTER — Encounter: Payer: Self-pay | Admitting: Physician Assistant

## 2018-01-26 VITALS — BP 124/86 | HR 71 | Temp 98.1°F | Ht 63.5 in | Wt 362.5 lb

## 2018-01-26 DIAGNOSIS — I10 Essential (primary) hypertension: Secondary | ICD-10-CM

## 2018-01-26 NOTE — Progress Notes (Signed)
BP 124/86 (BP Location: Right Wrist, Patient Position: Sitting, Cuff Size: Normal)   Pulse 71   Temp 98.1 F (36.7 C) (Oral)   Ht 5' 3.5" (1.613 m)   Wt (!) 362 lb 8 oz (164.4 kg)   LMP 01/12/2018   SpO2 98%   BMI 63.21 kg/m    Subjective:    Patient ID: Elizabeth Hendrix, female    DOB: 03/06/1981, 37 y.o.   MRN: [id number]  HPI: Elizabeth Hendrix is a 37 y.o. female presenting on 01/26/2018 for Follow-up (from ER)   HPI  Pt went to ER on 8/3 and was rx clindamycin for treatment of LLE cellultitis.  Notes from the ER state that pt had circumferential redness.  Pt states redness all resolved.    Relevant past medical, surgical, family and social history reviewed and updated as indicated. Interim medical history since our last visit reviewed. Allergies and medications reviewed and updated.  Review of Systems  Constitutional: Negative for appetite change, chills, diaphoresis, fatigue, fever and unexpected weight change.  HENT: Positive for sneezing. Negative for congestion, dental problem, drooling, ear pain, facial swelling, hearing loss, mouth sores, sore throat, trouble swallowing and voice change.   Eyes: Positive for visual disturbance. Negative for pain, discharge, redness and itching.  Respiratory: Negative for cough, choking, shortness of breath and wheezing.   Cardiovascular: Positive for leg swelling. Negative for chest pain and palpitations.  Gastrointestinal: Negative for abdominal pain, blood in stool, constipation, diarrhea and vomiting.  Endocrine: Negative for cold intolerance, heat intolerance and polydipsia.  Genitourinary: Negative for decreased urine volume, dysuria and hematuria.  Musculoskeletal: Negative for arthralgias, back pain and gait problem.  Skin: Negative for rash.  Allergic/Immunologic: Negative for environmental allergies.  Neurological: Negative for seizures, syncope, light-headedness and headaches.  Hematological: Negative for  adenopathy.  Psychiatric/Behavioral: Negative for agitation, dysphoric mood and suicidal ideas. The patient is not nervous/anxious.     Per HPI unless specifically indicated above     Objective:    BP 124/86 (BP Location: Right Wrist, Patient Position: Sitting, Cuff Size: Normal)   Pulse 71   Temp 98.1 F (36.7 C) (Oral)   Ht 5' 3.5" (1.613 m)   Wt (!) 362 lb 8 oz (164.4 kg)   LMP 01/12/2018   SpO2 98%   BMI 63.21 kg/m   Wt Readings from Last 3 Encounters:  01/26/18 (!) 362 lb 8 oz (164.4 kg)  01/17/18 (!) 375 lb (170.1 kg)  10/02/17 (!) 357 lb 12 oz (162.3 kg)    Physical Exam  Constitutional: She is oriented to person, place, and time. She appears well-developed and well-nourished.  HENT:  Head: Normocephalic and atraumatic.  Cardiovascular: Normal rate and regular rhythm.  Pulses:      Dorsalis pedis pulses are 2+ on the right side, and 2+ on the left side.  Pulmonary/Chest: Effort normal and breath sounds normal. No respiratory distress.  Musculoskeletal: She exhibits no edema.       Left lower leg: Normal. She exhibits no tenderness, no bony tenderness, no swelling and no edema.  Neurological: She is alert and oriented to person, place, and time.  Skin: Skin is warm and dry.  Psychiatric: She has a normal mood and affect. Her behavior is normal.  Nursing note and vitals reviewed.       Assessment & Plan:    Encounter Diagnoses  Name Primary?  . Essential hypertension, benign Yes  . Morbid obesity, unspecified obesity type (HCC)     -  counseled pt on need to walk regularly for exercise and counseled on need for weight loss to lessen chances for future problems with lower extremities -continue current medications -follow up October as scheduled.  RTO sooner prn

## 2018-04-02 ENCOUNTER — Ambulatory Visit: Payer: Self-pay | Admitting: Physician Assistant

## 2018-04-02 ENCOUNTER — Encounter: Payer: Self-pay | Admitting: Physician Assistant

## 2018-04-02 VITALS — BP 136/82 | HR 65 | Temp 97.0°F

## 2018-04-02 DIAGNOSIS — I10 Essential (primary) hypertension: Secondary | ICD-10-CM

## 2018-04-02 DIAGNOSIS — E785 Hyperlipidemia, unspecified: Secondary | ICD-10-CM

## 2018-04-02 NOTE — Progress Notes (Signed)
BP 136/82   Pulse 65   Temp (!) 97 F (36.1 C)   SpO2 98%    Subjective:    Patient ID: Elizabeth Hendrix, female    DOB: 1981-01-18, 37 y.o.   MRN: [id number]  HPI: Elizabeth Hendrix is a 36 y.o. female presenting on 04/02/2018 for Hypertension   HPI    Pt has insurance- bc/bs  Relevant past medical, surgical, family and social history reviewed and updated as indicated. Interim medical history since our last visit reviewed. Allergies and medications reviewed and updated.   Current Outpatient Medications:  .  amLODipine (NORVASC) 10 MG tablet, TAKE 1 TABLET BY MOUTH ONCE DAILY FOR BLOOD PRESSURE, Disp: 30 tablet, Rfl: 4 .  atenolol (TENORMIN) 25 MG tablet, TAKE 1 TABLET BY MOUTH ONCE DAILY, Disp: 30 tablet, Rfl: 4 .  hydrochlorothiazide (HYDRODIURIL) 25 MG tablet, TAKE 1 TABLET BY MOUTH ONCE DAILY, Disp: 30 tablet, Rfl: 4 .  loratadine (CLARITIN) 10 MG tablet, Take 10 mg by mouth daily., Disp: , Rfl:    Review of Systems  Constitutional: Negative for appetite change, chills, diaphoresis, fatigue, fever and unexpected weight change.  HENT: Negative for congestion, dental problem, drooling, ear pain, facial swelling, hearing loss, mouth sores, sneezing, sore throat, trouble swallowing and voice change.   Eyes: Negative for pain, discharge, redness, itching and visual disturbance.  Respiratory: Negative for cough, choking, shortness of breath and wheezing.   Cardiovascular: Negative for chest pain, palpitations and leg swelling.  Gastrointestinal: Negative for abdominal pain, blood in stool, constipation, diarrhea and vomiting.  Endocrine: Negative for cold intolerance, heat intolerance and polydipsia.  Genitourinary: Negative for decreased urine volume, dysuria and hematuria.  Musculoskeletal: Negative for arthralgias, back pain and gait problem.  Skin: Negative for rash.  Allergic/Immunologic: Negative for environmental allergies.  Neurological: Negative for  seizures, syncope, light-headedness and headaches.  Hematological: Negative for adenopathy.  Psychiatric/Behavioral: Negative for agitation, dysphoric mood and suicidal ideas. The patient is not nervous/anxious.     Per HPI unless specifically indicated above     Objective:    BP 136/82   Pulse 65   Temp (!) 97 F (36.1 C)   SpO2 98%   Wt Readings from Last 3 Encounters:  01/26/18 (!) 362 lb 8 oz (164.4 kg)  01/17/18 (!) 375 lb (170.1 kg)  10/02/17 (!) 357 lb 12 oz (162.3 kg)    Physical Exam  Constitutional: She is oriented to person, place, and time. She appears well-developed and well-nourished.  HENT:  Head: Normocephalic and atraumatic.  Neck: Neck supple.  Cardiovascular: Normal rate and regular rhythm.  Pulmonary/Chest: Effort normal and breath sounds normal.  Abdominal: Soft. Bowel sounds are normal. She exhibits no mass. There is no hepatosplenomegaly. There is no tenderness.  Musculoskeletal: She exhibits no edema.  Lymphadenopathy:    She has no cervical adenopathy.  Neurological: She is alert and oriented to person, place, and time.  Skin: Skin is warm and dry.  Psychiatric: She has a normal mood and affect. Her behavior is normal.  Vitals reviewed.       Assessment & Plan:    Encounter Diagnoses  Name Primary?  . Essential hypertension, benign Yes  . Morbid obesity, unspecified obesity type (HCC)   . Hyperlipidemia, unspecified hyperlipidemia type     -pt reminded of [facility] policy that we cannot treat insured patients.  Discussed with pt that she will need to find a new primary care provider with whom to establish care for her  HTN.  Pt states understanding

## 2022-06-05 ENCOUNTER — Other Ambulatory Visit: Payer: Self-pay

## 2022-06-05 ENCOUNTER — Encounter (HOSPITAL_COMMUNITY): Payer: Self-pay | Admitting: *Deleted

## 2022-06-05 ENCOUNTER — Emergency Department (HOSPITAL_COMMUNITY): Payer: BLUE CROSS/BLUE SHIELD

## 2022-06-05 ENCOUNTER — Emergency Department (HOSPITAL_COMMUNITY)
Admission: EM | Admit: 2022-06-05 | Discharge: 2022-06-05 | Disposition: A | Discharge status: Home / Self Care | Payer: BLUE CROSS/BLUE SHIELD | Attending: Emergency Medicine | Admitting: Emergency Medicine

## 2022-06-05 DIAGNOSIS — R002 Palpitations: Secondary | ICD-10-CM | POA: Diagnosis present

## 2022-06-05 LAB — CBC WITH DIFFERENTIAL/PLATELET
Abs Immature Granulocytes: 0.02 10*3/uL (ref 0.00–0.07)
Basophils Absolute: 0 10*3/uL (ref 0.0–0.1)
Basophils Relative: 1 %
Eosinophils Absolute: 0 10*3/uL (ref 0.0–0.5)
Eosinophils Relative: 0 %
HCT: 36.2 % (ref 36.0–46.0)
Hemoglobin: 11.2 g/dL — ABNORMAL LOW (ref 12.0–15.0)
Immature Granulocytes: 0 %
Lymphocytes Relative: 17 %
Lymphs Abs: 0.9 10*3/uL (ref 0.7–4.0)
MCH: 23.8 pg — ABNORMAL LOW (ref 26.0–34.0)
MCHC: 30.9 g/dL (ref 30.0–36.0)
MCV: 77 fL — ABNORMAL LOW (ref 80.0–100.0)
Monocytes Absolute: 0.6 10*3/uL (ref 0.1–1.0)
Monocytes Relative: 11 %
Neutro Abs: 4 10*3/uL (ref 1.7–7.7)
Neutrophils Relative %: 71 %
Platelets: 252 10*3/uL (ref 150–400)
RBC: 4.7 MIL/uL (ref 3.87–5.11)
RDW: 16.3 % — ABNORMAL HIGH (ref 11.5–15.5)
WBC: 5.5 10*3/uL (ref 4.0–10.5)
nRBC: 0 % (ref 0.0–0.2)

## 2022-06-05 LAB — URINALYSIS, ROUTINE W REFLEX MICROSCOPIC
Bacteria, UA: NONE SEEN
Bilirubin Urine: NEGATIVE
Glucose, UA: NEGATIVE mg/dL
Ketones, ur: NEGATIVE mg/dL
Nitrite: NEGATIVE
Protein, ur: 30 mg/dL — AB
Specific Gravity, Urine: 1.016 (ref 1.005–1.030)
pH: 6 (ref 5.0–8.0)

## 2022-06-05 LAB — COMPREHENSIVE METABOLIC PANEL
ALT: 22 U/L (ref 0–44)
AST: 18 U/L (ref 15–41)
Albumin: 3.8 g/dL (ref 3.5–5.0)
Alkaline Phosphatase: 53 U/L (ref 38–126)
Anion gap: 7 (ref 5–15)
BUN: 14 mg/dL (ref 6–20)
CO2: 26 mmol/L (ref 22–32)
Calcium: 9.2 mg/dL (ref 8.9–10.3)
Chloride: 107 mmol/L (ref 98–111)
Creatinine, Ser: 0.59 mg/dL (ref 0.44–1.00)
GFR, Estimated: >60 mL/min (ref >60)
Glucose, Bld: 139 mg/dL — ABNORMAL HIGH (ref 70–99)
Potassium: 3.3 mmol/L — ABNORMAL LOW (ref 3.5–5.1)
Sodium: 140 mmol/L (ref 135–145)
Total Bilirubin: 0.6 mg/dL (ref 0.3–1.2)
Total Protein: 7.1 g/dL (ref 6.5–8.1)

## 2022-06-05 LAB — TROPONIN I (HIGH SENSITIVITY)
Troponin I (High Sensitivity): 6 ng/L (ref <18)
Troponin I (High Sensitivity): 5 ng/L (ref <18)

## 2022-06-05 LAB — LIPASE, BLOOD: Lipase: 40 U/L (ref 11–51)

## 2022-06-05 LAB — D-DIMER, QUANTITATIVE: D-Dimer, Quant: 0.47 ug/mL-FEU (ref 0.00–0.50)

## 2022-06-05 MED ORDER — POTASSIUM CHLORIDE CRYS ER 20 MEQ PO TBCR: 20.0000 meq | EXTENDED_RELEASE_TABLET | Freq: Two times a day (BID) | ORAL | 0 refills | Status: AC

## 2022-06-05 MED ORDER — LACTATED RINGERS IV BOLUS
1000.0000 mL | Freq: Once | INTRAVENOUS | Status: AC
  Administered 2022-06-05: 1000 mL via INTRAVENOUS

## 2022-06-05 MED ORDER — HYDROXYZINE HCL 25 MG PO TABS: 25.0000 mg | ORAL_TABLET | Freq: Four times a day (QID) | ORAL | 0 refills | Status: AC

## 2022-06-05 NOTE — ED Notes (Signed)
Patient transported to X-ray 

## 2022-06-05 NOTE — ED Triage Notes (Signed)
Pt states last night she had a panic attack and since then she has been having discomfort in her chest

## 2022-06-05 NOTE — ED Provider Notes (Signed)
[facility] EMERGENCY DEPARTMENT Provider Note   CSN: [id number] Arrival date & time: 06/05/22  0820     History Chief Complaint  Patient presents with   Palpitations    HPI Elizabeth Hendrix is a 41 y.o. female presenting for chest pain and palpitations last night.  States that she has a history of panic disorder and that last night felt very similar.  Unfortunately, her symptoms were progressive throughout the evening.  She comes in this morning with persistent left-sided chest pain into her left shoulder.  She states that it has grossly improved after being in the emergency department for an hour and she feels asymptomatic now. She denies fevers or chills, nausea or vomiting, syncope or shortness of breath.. She is on an estrogen product.  Patient's recorded medical, surgical, social, medication list and allergies were reviewed in the Snapshot window as part of the initial history.   Review of Systems   Review of Systems  Constitutional:  Negative for chills and fever.  HENT:  Negative for ear pain and sore throat.   Eyes:  Negative for pain and visual disturbance.  Respiratory:  Negative for cough and shortness of breath.   Cardiovascular:  Positive for chest pain. Negative for palpitations.  Gastrointestinal:  Negative for abdominal pain and vomiting.  Genitourinary:  Negative for dysuria and hematuria.  Musculoskeletal:  Negative for arthralgias and back pain.  Skin:  Negative for color change and rash.  Neurological:  Negative for seizures and syncope.  All other systems reviewed and are negative.   Physical Exam Updated Vital Signs BP (!) 168/68   Pulse 73   Temp 98.1 F (36.7 C) (Oral)   Resp 18   Ht 5\' 3"  (1.6 m)   Wt (!) 160.6 kg   SpO2 100%   BMI 62.71 kg/m  Physical Exam Vitals and nursing note reviewed.  Constitutional:      General: She is not in acute distress.    Appearance: She is well-developed.  HENT:     Head: Normocephalic and  atraumatic.  Eyes:     Conjunctiva/sclera: Conjunctivae normal.  Cardiovascular:     Rate and Rhythm: Normal rate and regular rhythm.     Heart sounds: No murmur heard. Pulmonary:     Effort: Pulmonary effort is normal. No respiratory distress.     Breath sounds: Normal breath sounds.  Abdominal:     General: There is no distension.     Palpations: Abdomen is soft.     Tenderness: There is no abdominal tenderness. There is no right CVA tenderness or left CVA tenderness.  Musculoskeletal:        General: No swelling or tenderness. Normal range of motion.     Cervical back: Neck supple.  Skin:    General: Skin is warm and dry.  Neurological:     General: No focal deficit present.     Mental Status: She is alert and oriented to person, place, and time. Mental status is at baseline.     Cranial Nerves: No cranial nerve deficit.      ED Course/ Medical Decision Making/ A&P    Procedures Procedures   Medications Ordered in ED Medications  lactated ringers bolus 1,000 mL (1,000 mLs Intravenous Bolus 06/05/22 1015)   Medical Decision Making: Elizabeth Hendrix is a 41 y.o. female who presented to the ED today with chest pain, detailed above.  Based on patient's comorbidities, patient has a heart score of 3.    Patient's  presentation is complicated by their history of multiple comorbid medical problems.  Patient placed on continuous vitals and telemetry monitoring while in ED which was reviewed periodically.  Complete initial physical exam performed, notably the patient was HDS in NAD.   Reviewed and confirmed nursing documentation for past medical history, family history, social history.    Initial Assessment: With the patient's presentation of left-sided chest pain, most likely diagnosis is musculoskeletal chest pain versus GERD, although ACS remains on the differential. Other diagnoses were considered including (but not limited to) pulmonary embolism, community-acquired  pneumonia, aortic dissection, pneumothorax, underlying bony abnormality, anemia. These are considered less likely due to history of present illness and physical exam findings.    In particular, concerning pulmonary embolism: Patient is PERC + and the they deny malignancy, recent surgery, history of DVT, or calf tenderness leading to a low risk Wells score. Aortic Dissection also reconsidered but seems less likely based on the location, quality, onset, and severity of symptoms in this case. Patient has a lack of serious comorbidities for this condition including a lack of Smoking. Patient also has a lack of underlying history of AD or TAA.  This is most consistent with an acute life/limb threatening illness complicated by underlying chronic conditions.   Initial Plan: Evaluate for ACS with single troponin and EKG evaluated as below  Evaluate for dissection, bony abnormality, or pneumonia with chest x-ray and screening laboratory evaluation including CBC, BMP  Further evaluation for pulmonary embolism indicated at this time based on patient's PERC and Wells score. Will proceed with Ddimer since low WELLS Further evaluation for Thoracic Aortic Dissection not indicated at this time based on patient's clinical history and PE findings.   Initial Study Results: EKG was reviewed independently. Rate, rhythm, axis, intervals all examined and without medically relevant abnormality. ST segments without concerns for elevations.    Laboratory  Single troponin demonstrated   CBC and BMP without obvious metabolic or inflammatory abnormalities requiring further evaluation   Radiology  DG Chest 2 View  Result Date: 06/05/2022 CLINICAL DATA:  Provided history: Shortness of breath.  Chest pain. EXAM: CHEST - 2 VIEW COMPARISON:  No pertinent prior exams available for comparison. FINDINGS: Shallow inspiration radiograph with accentuation of the central bronchovascular markings. No appreciable airspace  consolidation. No evidence of pleural effusion or pneumothorax. Heart size within normal limits. Degenerative changes of the spine. IMPRESSION: Shallow inspiration radiograph. No evidence of acute cardiopulmonary abnormality. Electronically Signed   By: Jackey Loge D.O.   On: 06/05/2022 09:28    Final Assessment and Plan: Patient observed in emergency department for 3 and half hours.  She remains grossly asymptomatic after this prolonged observation.  She denies fevers or chills, nausea vomiting, syncope shortness of breath.  She otherwise ambulatory tolerating p.o. intake.  Given her well appearance, I do not believe there is any indication for further intervention in the emergency department. Given reassuring objective findings, ACS and pulmonary embolism are considered grossly less likely.  Given well appearance, likely more benign etiology.  We discussed palpitations versus panic disorder versus musculoskeletal etiology and patient is in agreement at this time.  Patient stable for outpatient care management with strict return precautions.  Disposition:  I have considered need for hospitalization, however, considering all of the above, I believe this patient is stable for discharge at this time.  Patient/family educated about specific return precautions for given chief complaint and symptoms.  Patient/family educated about follow-up with PCP.     Patient/family expressed  understanding of return precautions and need for follow-up. Patient spoken to regarding all imaging and laboratory results and appropriate follow up for these results. All education provided in verbal form with additional information in written form. Time was allowed for answering of patient questions. Patient discharged.    Emergency Department Medication Summary:   Medications  lactated ringers bolus 1,000 mL (1,000 mLs Intravenous Bolus 06/05/22 1015)                  Clinical Impression:  1. Palpitations       Discharge   Final Clinical Impression(s) / ED Diagnoses Final diagnoses:  Palpitations    Rx / DC Orders ED Discharge Orders          Ordered    hydrOXYzine (ATARAX) 25 MG tablet  Every 6 hours        06/05/22 1153    potassium chloride SA (KLOR-CON M) 20 MEQ tablet  2 times daily        06/05/22 1153              Glyn Ade, MD 06/05/22 1153
